# Patient Record
Sex: Male | Born: 1935 | Race: White | Hispanic: No | Marital: Married | Smoking: Former smoker
Health system: Southern US, Community
[De-identification: ages and names within clinical notes are randomized; demographics above are authoritative.]

## PROBLEM LIST (undated history)

## (undated) DIAGNOSIS — I10 Essential (primary) hypertension: Secondary | ICD-10-CM

## (undated) DIAGNOSIS — K5792 Diverticulitis of intestine, part unspecified, without perforation or abscess without bleeding: Secondary | ICD-10-CM

## (undated) DIAGNOSIS — K219 Gastro-esophageal reflux disease without esophagitis: Secondary | ICD-10-CM

## (undated) DIAGNOSIS — E785 Hyperlipidemia, unspecified: Secondary | ICD-10-CM

## (undated) DIAGNOSIS — I5022 Chronic systolic (congestive) heart failure: Secondary | ICD-10-CM

## (undated) DIAGNOSIS — E119 Type 2 diabetes mellitus without complications: Secondary | ICD-10-CM

## (undated) DIAGNOSIS — R252 Cramp and spasm: Secondary | ICD-10-CM

## (undated) DIAGNOSIS — T508X5A Adverse effect of diagnostic agents, initial encounter: Secondary | ICD-10-CM

## (undated) DIAGNOSIS — I4891 Unspecified atrial fibrillation: Secondary | ICD-10-CM

## (undated) DIAGNOSIS — N429 Disorder of prostate, unspecified: Secondary | ICD-10-CM

## (undated) DIAGNOSIS — I255 Ischemic cardiomyopathy: Secondary | ICD-10-CM

## (undated) DIAGNOSIS — N183 Chronic kidney disease, stage 3 unspecified: Secondary | ICD-10-CM

## (undated) DIAGNOSIS — K922 Gastrointestinal hemorrhage, unspecified: Secondary | ICD-10-CM

## (undated) DIAGNOSIS — I9789 Other postprocedural complications and disorders of the circulatory system, not elsewhere classified: Secondary | ICD-10-CM

## (undated) DIAGNOSIS — I2510 Atherosclerotic heart disease of native coronary artery without angina pectoris: Secondary | ICD-10-CM

## (undated) DIAGNOSIS — Z789 Other specified health status: Secondary | ICD-10-CM

## (undated) HISTORY — DX: Ischemic cardiomyopathy: I25.5

## (undated) HISTORY — DX: Essential (primary) hypertension: I10

## (undated) HISTORY — DX: Gastrointestinal hemorrhage, unspecified: K92.2

## (undated) HISTORY — DX: Chronic kidney disease, stage 3 (moderate): N18.3

## (undated) HISTORY — DX: Gastro-esophageal reflux disease without esophagitis: K21.9

## (undated) HISTORY — DX: Other postprocedural complications and disorders of the circulatory system, not elsewhere classified: I97.89

## (undated) HISTORY — DX: Atherosclerotic heart disease of native coronary artery without angina pectoris: I25.10

## (undated) HISTORY — DX: Chronic systolic (congestive) heart failure: I50.22

## (undated) HISTORY — DX: Disorder of prostate, unspecified: N42.9

## (undated) HISTORY — PX: ROTATOR CUFF REPAIR: SHX139

## (undated) HISTORY — PX: TONSILLECTOMY: SUR1361

## (undated) HISTORY — DX: Adverse effect of diagnostic agents, initial encounter: T50.8X5A

## (undated) HISTORY — PX: OTHER SURGICAL HISTORY: SHX169

## (undated) HISTORY — DX: Chronic kidney disease, stage 3 unspecified: N18.30

## (undated) HISTORY — DX: Diverticulitis of intestine, part unspecified, without perforation or abscess without bleeding: K57.92

## (undated) HISTORY — DX: Unspecified atrial fibrillation: I48.91

## (undated) HISTORY — DX: Other specified health status: Z78.9

## (undated) HISTORY — DX: Hyperlipidemia, unspecified: E78.5

## (undated) HISTORY — DX: Cramp and spasm: R25.2

## (undated) HISTORY — DX: Type 2 diabetes mellitus without complications: E11.9

---

## 2000-06-07 DIAGNOSIS — K922 Gastrointestinal hemorrhage, unspecified: Secondary | ICD-10-CM

## 2000-06-07 HISTORY — DX: Gastrointestinal hemorrhage, unspecified: K92.2

## 2001-04-07 HISTORY — PX: COLONOSCOPY: SHX174

## 2006-09-27 ENCOUNTER — Encounter: Payer: Self-pay | Admitting: Cardiology

## 2006-09-27 ENCOUNTER — Ambulatory Visit: Payer: Self-pay | Admitting: Cardiology

## 2007-05-08 HISTORY — PX: CORONARY ARTERY BYPASS GRAFT: SHX141

## 2007-05-29 ENCOUNTER — Ambulatory Visit: Payer: Self-pay | Admitting: Cardiology

## 2007-05-29 ENCOUNTER — Inpatient Hospital Stay (HOSPITAL_COMMUNITY): Admission: AD | Admit: 2007-05-29 | Discharge: 2007-06-09 | Payer: Self-pay | Admitting: Cardiology

## 2007-05-29 ENCOUNTER — Ambulatory Visit: Payer: Self-pay | Admitting: Cardiovascular Disease

## 2007-05-30 ENCOUNTER — Encounter: Payer: Self-pay | Admitting: Thoracic Surgery (Cardiothoracic Vascular Surgery)

## 2007-05-30 ENCOUNTER — Ambulatory Visit: Payer: Self-pay | Admitting: Thoracic Surgery (Cardiothoracic Vascular Surgery)

## 2007-05-30 ENCOUNTER — Encounter: Payer: Self-pay | Admitting: Cardiology

## 2007-06-07 ENCOUNTER — Encounter: Payer: Self-pay | Admitting: Cardiology

## 2007-06-29 ENCOUNTER — Ambulatory Visit: Payer: Self-pay | Admitting: Cardiology

## 2007-06-29 LAB — CONVERTED CEMR LAB
BUN: 20 mg/dL (ref 6–23)
CO2: 27 meq/L (ref 19–32)
Calcium: 9.9 mg/dL (ref 8.4–10.5)
Chloride: 96 meq/L (ref 96–112)
Creatinine, Ser: 1.6 mg/dL — ABNORMAL HIGH (ref 0.4–1.5)
GFR calc Af Amer: 54 mL/min
Glucose, Bld: 169 mg/dL — ABNORMAL HIGH (ref 70–99)
Potassium: 4.6 meq/L (ref 3.5–5.1)

## 2007-07-19 ENCOUNTER — Encounter
Admission: RE | Admit: 2007-07-19 | Discharge: 2007-07-19 | Payer: Self-pay | Admitting: Thoracic Surgery (Cardiothoracic Vascular Surgery)

## 2007-07-19 ENCOUNTER — Ambulatory Visit: Payer: Self-pay | Admitting: Thoracic Surgery (Cardiothoracic Vascular Surgery)

## 2007-07-26 ENCOUNTER — Encounter: Payer: Self-pay | Admitting: Cardiology

## 2007-08-08 ENCOUNTER — Ambulatory Visit: Payer: Self-pay | Admitting: Cardiology

## 2007-09-12 ENCOUNTER — Ambulatory Visit: Payer: Self-pay | Admitting: Cardiology

## 2007-09-19 ENCOUNTER — Encounter: Payer: Self-pay | Admitting: Physician Assistant

## 2007-11-09 ENCOUNTER — Ambulatory Visit: Payer: Self-pay | Admitting: Cardiology

## 2008-01-30 ENCOUNTER — Encounter: Payer: Self-pay | Admitting: Cardiology

## 2008-02-07 ENCOUNTER — Encounter: Payer: Self-pay | Admitting: Cardiology

## 2008-03-21 ENCOUNTER — Ambulatory Visit: Payer: Self-pay | Admitting: Cardiology

## 2008-05-07 ENCOUNTER — Encounter: Payer: Self-pay | Admitting: Cardiology

## 2008-05-24 IMAGING — CR DG CHEST 2V
2 series · 2 of 2 positions shown · non-contrast
Comparison: 06/02/07 and 05/29/07.

CLINICAL DATA: Coronary syndrome.  Cough, shortness of breath.  
 TWO VIEW CHEST:

[w chest pa]
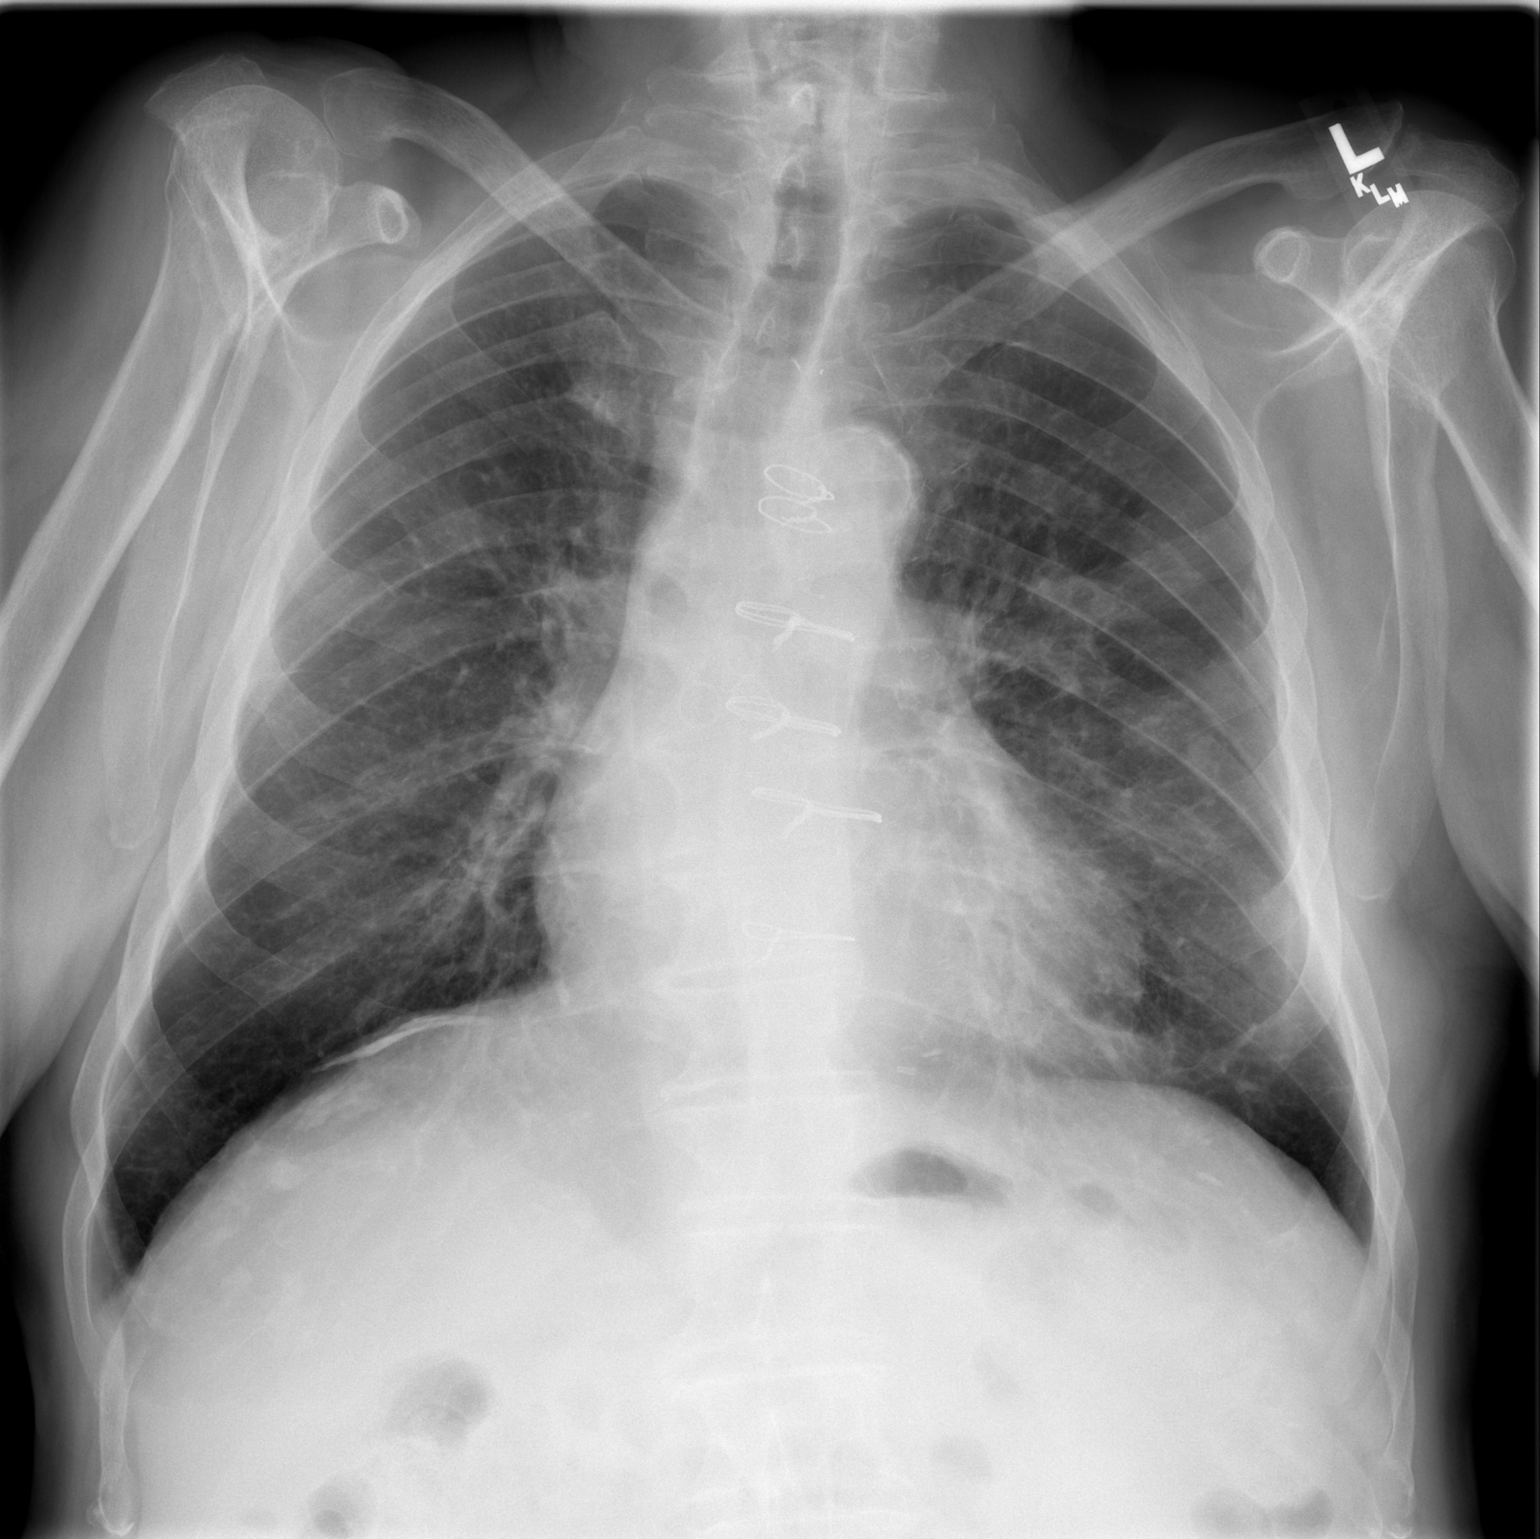

[w chest lat]
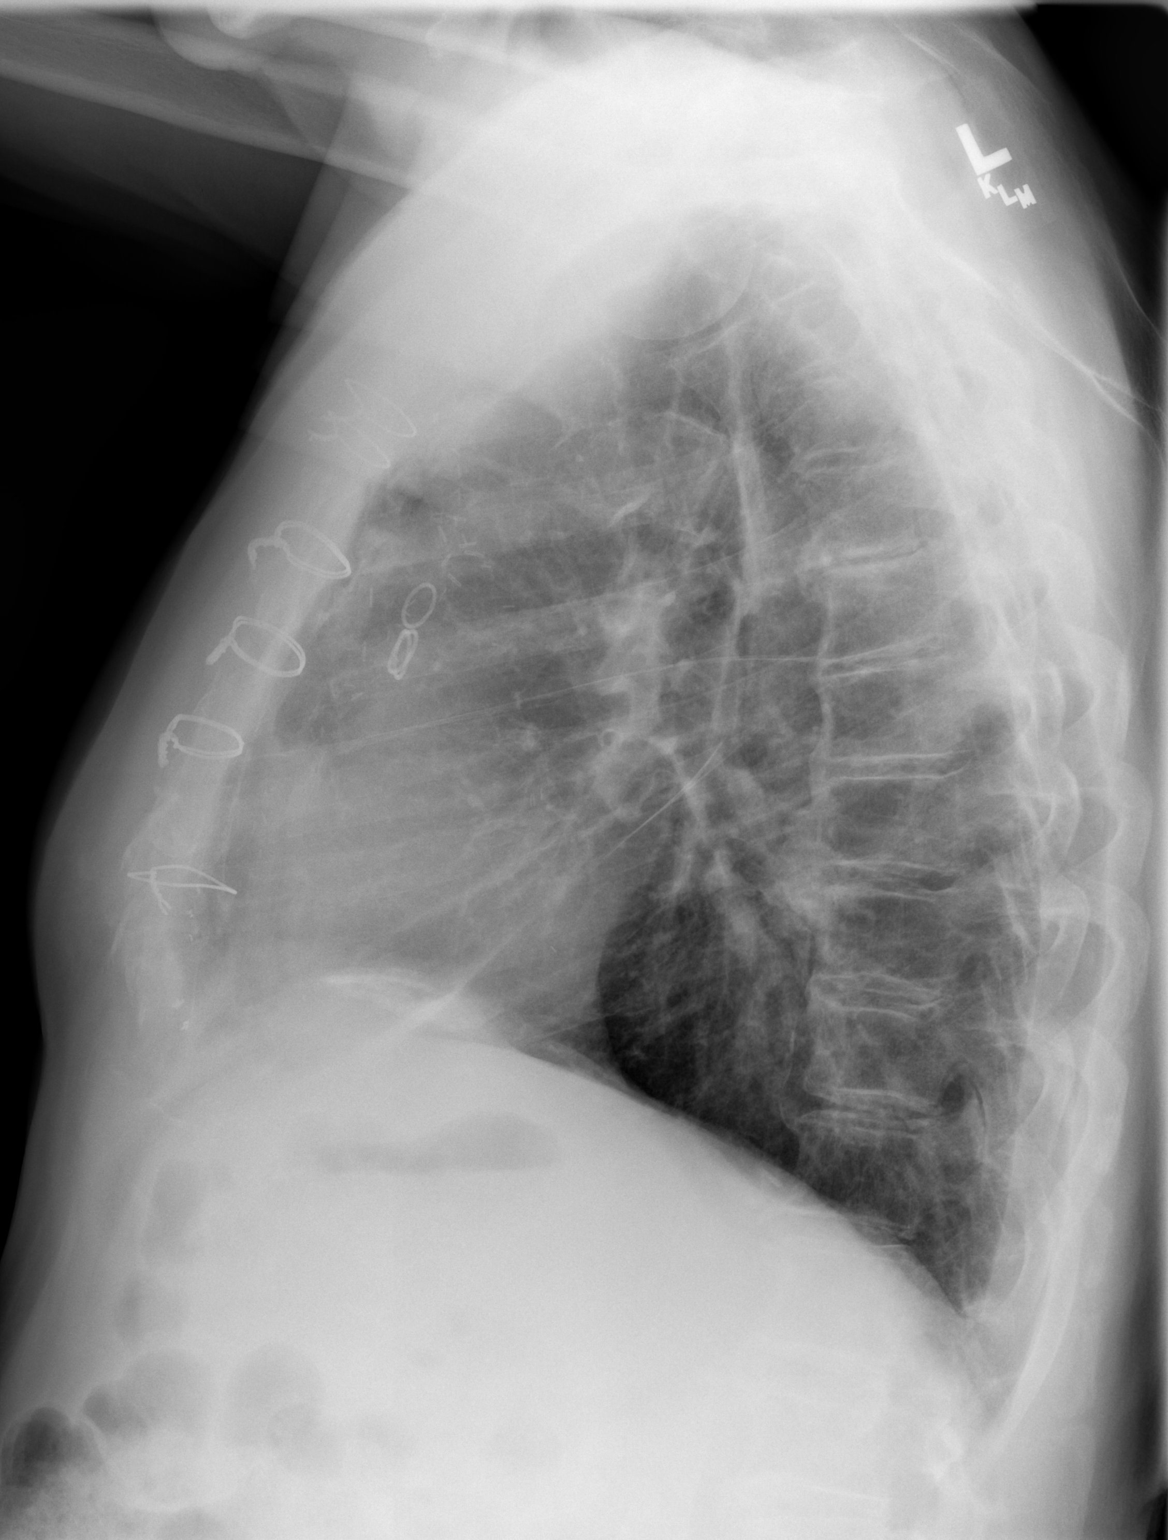

[2 of 2 positions shown; findings below may reference images not displayed]

FINDINGS: Trachea is midline.  Heart is mildly enlarged.  Thoracic aorta is calcified.  There are nodular densities in the left mid lung zone.  Lungs are otherwise clear.  Calcified pleural plaque is seen along the right hemidiaphragm.
IMPRESSION: Opacities seen in the left mid lung zone may be due to pleural plaque.  Pulmonary parenchymal air space disease not excluded.

## 2008-09-02 ENCOUNTER — Encounter: Payer: Self-pay | Admitting: Cardiology

## 2008-09-04 ENCOUNTER — Encounter: Payer: Self-pay | Admitting: Cardiology

## 2008-09-16 ENCOUNTER — Ambulatory Visit: Payer: Self-pay | Admitting: Cardiology

## 2008-09-19 ENCOUNTER — Encounter: Payer: Self-pay | Admitting: Cardiology

## 2008-09-19 DIAGNOSIS — N289 Disorder of kidney and ureter, unspecified: Secondary | ICD-10-CM

## 2008-10-04 ENCOUNTER — Other Ambulatory Visit: Admission: RE | Admit: 2008-10-04 | Discharge: 2008-10-04 | Payer: Self-pay | Admitting: Otolaryngology

## 2009-03-25 ENCOUNTER — Encounter: Payer: Self-pay | Admitting: Cardiology

## 2009-03-26 ENCOUNTER — Ambulatory Visit: Payer: Self-pay | Admitting: Cardiology

## 2010-01-05 ENCOUNTER — Telehealth (INDEPENDENT_AMBULATORY_CARE_PROVIDER_SITE_OTHER): Payer: Self-pay | Admitting: *Deleted

## 2010-01-05 ENCOUNTER — Ambulatory Visit: Payer: Self-pay | Admitting: Physician Assistant

## 2010-01-07 ENCOUNTER — Encounter: Payer: Self-pay | Admitting: Physician Assistant

## 2010-01-08 ENCOUNTER — Encounter: Payer: Self-pay | Admitting: Physician Assistant

## 2010-01-08 ENCOUNTER — Ambulatory Visit: Payer: Self-pay | Admitting: Cardiology

## 2010-01-09 ENCOUNTER — Encounter: Payer: Self-pay | Admitting: Physician Assistant

## 2010-01-14 ENCOUNTER — Ambulatory Visit: Payer: Self-pay | Admitting: Physician Assistant

## 2010-01-21 ENCOUNTER — Encounter: Payer: Self-pay | Admitting: Physician Assistant

## 2010-01-30 ENCOUNTER — Ambulatory Visit: Payer: Self-pay | Admitting: Cardiology

## 2010-01-30 ENCOUNTER — Encounter: Payer: Self-pay | Admitting: Physician Assistant

## 2010-02-12 ENCOUNTER — Encounter: Payer: Self-pay | Admitting: Physician Assistant

## 2010-03-13 ENCOUNTER — Encounter: Payer: Self-pay | Admitting: Physician Assistant

## 2010-03-16 ENCOUNTER — Ambulatory Visit: Payer: Self-pay | Admitting: Cardiology

## 2010-03-19 ENCOUNTER — Encounter: Payer: Self-pay | Admitting: Cardiology

## 2010-04-15 ENCOUNTER — Ambulatory Visit: Payer: Self-pay | Admitting: Cardiology

## 2010-05-13 ENCOUNTER — Encounter: Payer: Self-pay | Admitting: Cardiology

## 2010-05-13 ENCOUNTER — Ambulatory Visit: Payer: Self-pay | Admitting: Cardiology

## 2010-06-03 ENCOUNTER — Encounter: Payer: Self-pay | Admitting: Cardiology

## 2010-06-12 ENCOUNTER — Telehealth (INDEPENDENT_AMBULATORY_CARE_PROVIDER_SITE_OTHER): Payer: Self-pay | Admitting: *Deleted

## 2010-06-28 ENCOUNTER — Encounter: Payer: Self-pay | Admitting: Thoracic Surgery (Cardiothoracic Vascular Surgery)

## 2010-07-07 NOTE — Progress Notes (Signed)
Summary: SOB  Phone Note Call from Patient   Summary of Call: Pt's dgt, Jenny Reichmann, called stating pt started with hoarseness on Thursday. She states since Friday he has been really weak and SOB. She states they have one of those balls that you blow on and he can't even blow 1/2 way. She would like pt seen this week. Pt's dgt notified he should be evaluated at ER for sudden development of SOB. Pt's dgt states she doesn't think he's bad enough to need to be seen in ER but would like him seen ASAP in the office. Pt added to Gene's schedule today at 2pm. Pt's dgt notified pt will be seeing PA not MD. Initial call taken by: Gurney Maxin, RN, BSN,  January 05, 2010 8:26 AM

## 2010-07-07 NOTE — Assessment & Plan Note (Signed)
Summary: 1 MO F/U PER 10/10 OV-JM   Visit Type:  Follow-up Primary John Moore:  John Moore  CC:  cardiomyopathy  /  hypertension.  History of Present Illness: The patient is seen for followup of cardiomyopathy.  I saw him last on March 16, 2010.  At that time we were working to clarify his medications and adjust them slightly.  The plan was to be sure that he was taking his diuretic.  Also I kept him on the same dose of carvedilol area he's had elevated potassium in the past.  We did not give him extra potassium.  He returns today feeling well.  However his blood pressure still elevated.  He says that his pressure was much lower at home today.  He may have a problem with his blood pressure cuff and he is receiving a new one for his birthday this week.  Preventive Screening-Counseling & Management  Alcohol-Tobacco     Smoking Status: quit     Year Quit: 1980  Current Medications (verified): 1)  Benicar 40 Mg Tabs (Olmesartan Medoxomil) .... Take 1 Tab By Mouth At Bedtime 2)  Glimepiride 4 Mg Tabs (Glimepiride) .... Take 1 Tablet By Mouth Two Times A Day 3)  Multivitamins   Tabs (Multiple Vitamin) .... Take 1 Tablet By Mouth Once A Day 4)  Aspirin 81 Mg Tbec (Aspirin) .... Take One Tablet By Mouth Daily 5)  Metformin Hcl 500 Mg Tabs (Metformin Hcl) .... Take 2 By Mouth in Morning and 1 in Evening 6)  Simvastatin 40 Mg Tabs (Simvastatin) .... Take 1 Tab By Mouth At Bedtime 7)  Uroxatral 10 Mg Xr24h-Tab (Alfuzosin Hcl) .... Take 1 Tab By Mouth At Bedtime 8)  Nitrostat 0.4 Mg Subl (Nitroglycerin) .Marland Kitchen.. 1 Tablet Under Tongue At Onset of Chest Pain; You May Repeat Every 5 Minutes For Up To 3 Doses. 9)  Furosemide 40 Mg Tabs (Furosemide) .... Take One Tablet By Mouth Once Daily 10)  Coreg 12.5 Mg Tabs (Carvedilol) .... Take 1 Tablet By Mouth Two Times A Day  Allergies (verified): 1)  ! * Ivp Dye  Comments:  Nurse/Medical Assistant: The patient's medication list and allergies were reviewed  with the patient and were updated in the Medication and Allergy Lists.  Past History:  Past Medical History:  * NIPPLE DISCOMFORT...resolved October, 2010 LEG CRAMPS (ICD-729.82)...nighttime * BURPING AND HICCUPS....question reflux..improved October, 2010 * ACE INHIBITOR.. INTOLERANCE....tolerates ARB GI BLEEDING (ICD-578.9)...lower.. 2002.. * CONTRAST DYE ALLERGY RENAL INSUFFICIENCY (ICD-588.9)...mild..creatinine 1.5.. September 19, 2008 DM (ICD-250.00) DYSLIPIDEMIA (ICD-272.4) HYPERTENSION (ICD-401.9) ATRIAL FIBRILLATION, HX OF (ICD-V12.59)...postop CABG...... brief treatment amiodarone... stopped... NSR * EF 40-45%.Marland Kitchenecho... December, 2008... 20-25% by echo, 8/11 Pulmonary hypertension, severe Mitral regurgitation... mild.. echo... 2008... mild/moderate  by echo, 8/11 CORONARY ARTERY BYPASS GRAFT, HX OF (ICD-V45.81)..December, 2008 CAD (ICD-414.00)  Review of Systems       Patient denies fever, chills, headache, sweats, rash, change in vision, change in hearing, chest pain, cough, nausea vomiting, urinary symptoms.  All other systems are reviewed and are negative.  Vital Signs:  Patient profile:   77 year old male Height:      69 inches Weight:      175 pounds Pulse rate:   56 / minute BP sitting:   181 / 101  (left arm) Cuff size:   regular  Vitals Entered By: Georgina Peer (April 15, 2010 1:46 PM)  Physical Exam  General:  patient looks good. Eyes:  no xanthelasma. Neck:  no jugular venous distention. Lungs:  lungs are clear.  Respiratory effort is nonlabored. Heart:  cardiac exam reveals a fullness no clicks or significant murmurs Abdomen:  abdomen is soft. Extremities:  no peripheral edema. Psych:  patient is oriented to person time and place.  Affect is normal.  He is here with his daughter today.   Impression & Recommendations:  Problem # 1:  * ELEVATED POTASSIUM His potassium is stabilized and we are careful not to give him extra.  Problem # 2:  CHRONIC  SYSTOLIC HEART FAILURE (123456)  His updated medication list for this problem includes:    Benicar 40 Mg Tabs (Olmesartan medoxomil) .Marland Kitchen... Take 1 tab by mouth at bedtime    Aspirin 81 Mg Tbec (Aspirin) .Marland Kitchen... Take one tablet by mouth daily    Nitrostat 0.4 Mg Subl (Nitroglycerin) .Marland Kitchen... 1 tablet under tongue at onset of chest pain; you may repeat every 5 minutes for up to 3 doses.    Furosemide 40 Mg Tabs (Furosemide) .Marland Kitchen... Take one tablet by mouth once daily    Coreg 12.5 Mg Tabs (Carvedilol) .Marland Kitchen... Take 1 tablet by mouth two times a day    Amlodipine Besylate 5 Mg Tabs (Amlodipine besylate) .Marland Kitchen... Take 1/2 tablet by mouth once daily for 1 week and then increase to 1 tablet once daily. His blood pressure is too high and his heart rate is controlled.  I decided to add amlodipine.  It is possible that hypertension has been the cause of his LV dysfunction.  Will start at 2.5 mg daily but increase it to 5 mg daily after one week.  We'll see him back in 3-4 weeks.  Problem # 3:  CAD (ICD-414.00)  His updated medication list for this problem includes:    Aspirin 81 Mg Tbec (Aspirin) .Marland Kitchen... Take one tablet by mouth daily    Nitrostat 0.4 Mg Subl (Nitroglycerin) .Marland Kitchen... 1 tablet under tongue at onset of chest pain; you may repeat every 5 minutes for up to 3 doses.    Coreg 12.5 Mg Tabs (Carvedilol) .Marland Kitchen... Take 1 tablet by mouth two times a day    Amlodipine Besylate 5 Mg Tabs (Amlodipine besylate) .Marland Kitchen... Take 1/2 tablet by mouth once daily for 1 week and then increase to 1 tablet once daily. Coronary disease stable.  No change in therapy.  Patient Instructions: 1)  Follow up with Dr. Ron Parker on Wed, May 13, 2010 at 1pm. 2)  Start Norvasc (amlodipine) 5mg  1/2 tablet by mouth once daily for 1 week and then increase to 1 tablet by mouth once daily. Prescriptions: AMLODIPINE BESYLATE 5 MG TABS (AMLODIPINE BESYLATE) Take 1/2 tablet by mouth once daily for 1 week and then increase to 1 tablet once daily.  #30  x 2   Entered by:   Gurney Maxin, RN, BSN   Authorized by:   Lorenza Evangelist, MD, [facility]   Signed by:   Gurney Maxin, RN, BSN on 04/15/2010   Method used:   Electronically to        [facility] (retail)       [address]       [place], [place]  [address]       Ph: [phone]       Fax: [phone]   RxID:   [id number]   Handout requested.

## 2010-07-07 NOTE — Medication Information (Signed)
Summary: RX Folder/ CARVEDILOL  RX Folder/ CARVEDILOL   Imported By: Bartholomew Boards 03/24/2010 14:58:57  _____________________________________________________________________  External Attachment:    Type:   Image     Comment:   External Document

## 2010-07-07 NOTE — Assessment & Plan Note (Signed)
Summary: DGT REQUEST APPT D/T SOB-JM   Visit Type:  sob Primary Provider:  Jerene Bears   History of Present Illness: patient presents for evaluation of dyspnea.  He was last seen here in clinic in October 2010, by Dr. Ron Parker, in followup of multivessel CAD, status post CABG, December 2008. He has not had subsequent ischemic workup.  He presents with complaint of exertional dyspnea over the past few weeks. He denies symptoms suggestive of congestive heart failure. He denies frank exertional chest discomfort, or tachycardia palpitations. He denies presyncope/syncope or falls. There is some suggestion that these symptoms are similar to those which preceded his bypass surgery.  Preventive Screening-Counseling & Management  Alcohol-Tobacco     Smoking Status: quit     Year Quit: 1980  Current Medications (verified): 1)  Benicar 40 Mg Tabs (Olmesartan Medoxomil) .... Take 1 Tab By Mouth At Bedtime 2)  Metoprolol Succinate 50 Mg Xr24h-Tab (Metoprolol Succinate) .... Take 1 Tablet By Mouth Once A Day 3)  Glimepiride 4 Mg Tabs (Glimepiride) .... Take 1 Tablet By Mouth Two Times A Day 4)  Multivitamins   Tabs (Multiple Vitamin) .... Take 1 Tablet By Mouth Once A Day 5)  Aspirin 81 Mg Tbec (Aspirin) .... Take One Tablet By Mouth Daily 6)  Metformin Hcl 500 Mg Tabs (Metformin Hcl) .... Take 1 Tablet By Mouth Two Times A Day 7)  Simvastatin 40 Mg Tabs (Simvastatin) .... Take 1 Tab By Mouth At Bedtime 8)  Uroxatral 10 Mg Xr24h-Tab (Alfuzosin Hcl) .... Take 1 Tab By Mouth At Bedtime  Allergies (verified): 1)  ! * Ivp Dye  Comments:  Nurse/Medical Assistant: The patient's medication list and allergies were reviewed with the patient and were updated in the Medication and Allergy Lists.  Past History:  Past Medical History: Last updated: 03/26/2009  * NIPPLE DISCOMFORT...resolved October, 2010 LEG CRAMPS (ICD-729.82)...nighttime * BURPING AND HICCUPS....question reflux..improved October,  2010 * ACE INHIBITOR.. INTOLERANCE....tolerates ARB GI BLEEDING (ICD-578.9)...lower.. 2002 * CONTRAST DYE ALLERGY RENAL INSUFFICIENCY (ICD-588.9)...mild..creatinine 1.5.. September 19, 2008 DM (ICD-250.00) DYSLIPIDEMIA (ICD-272.4) HYPERTENSION (ICD-401.9) ATRIAL FIBRILLATION, HX OF (ICD-V12.59)...postop CABG...... brief treatment amiodarone... stopped... NSR * EF 40-45%.Marland Kitchenecho... December, 2008 Mitral regurgitation... mild.. echo... 2008 CORONARY ARTERY BYPASS GRAFT, HX OF (ICD-V45.81)..December, 2008 CAD (ICD-414.00)  Review of Systems       No fevers, chills, hemoptysis, dysphagia, melena, hematocheezia, hematuria, rash, claudication, orthopnea, pnd, pedal edema. All other systems negative.   Vital Signs:  Patient profile:   77 year old male Height:      69 inches Weight:      181 pounds BMI:     26.83 O2 Sat:      97 % on Room air Pulse rate:   67 / minute BP sitting:   176 / 119  (left arm) Cuff size:   regular  Vitals Entered By: Georgina Peer (January 05, 2010 2:30 PM)  Nutrition Counseling: Patient's BMI is greater than 25 and therefore counseled on weight management options.  O2 Flow:  Room air  Serial Vital Signs/Assessments:  Time      Position  BP       Pulse  Resp  Temp     By 2:33 PM             185/78   71                    Georgina Peer  Comments: 2:33 PM recheck left arm/reg cuff By: Georgina Peer  Physical Exam  Additional Exam:  GEN:77 year old male, sitting upright, in no distress HEENT: NCAT,PERRLA,EOMI NECK: palpable pulses, no bruits; no JVD; no TM LUNGS: CTA bilaterally HEART: RRR (S1S2); soft, grade A999333 systolic ejection murmur; positive S4; no rubs ABD: soft, NT; intact BS EXT: intact distal pulses; trace peripheral edema SKIN: warm, dry; somewhat pallid MUSC: no obvious deformity NEURO: A/O (x3)     EKG  Procedure date:  01/05/2010  Findings:      normal sinus rhythm with ventricular bigeminy at 67 bpm; question prior  septal infarct  Impression & Recommendations:  Problem # 1:  DYSPNEA ON EXERTION (ICD-786.09)  patient presents with complaint of DOE, which may represent an anginal equivalent. He denies any symptoms at rest, and denies exertional chest discomfort. Will initiate evaluation with a 2-D echo for reassessment of LV function. Records indicate prior assessment at 40-45% by 2-D echo, December 2008. We'll also order complete blood work, with complete metabolic profile, CBC, TSH, BNP, and FLP. Will schedule early return followup with myself and Dr. Ron Parker for review, and further recommendations. Patient has also been advised to proceed directly to the ED, in the event of worsening symptoms or new onset angina pectoris.  Will also increase Toprol to 50 b.i.d., for better blood pressure control as well as suppression of documented PVCs. We'll prescribe p.r.n. nitroglycerin.  Problem # 2:  HYPERTENSION (ICD-401.9)  increase Toprol, as above.  Problem # 3:  DYSLIPIDEMIA (ICD-272.4)  reassess with a fasting lipid profile. Aggressive management recommended with target LDL of 70 or less, if feasible.  Problem # 4:  ATRIAL FIBRILLATION, HX OF (ICD-V12.59)  maintaining NSR with documented ventricular bigeminy, at this time.  Problem # 5:  MITRAL REGURGITATION (ICD-396.3)  we'll reassess severity with 2-D echo, previously assessed as mild in 2008.  Other Orders: EKG w/ Interpretation (93000) T-Comprehensive Metabolic Panel (A999333) T-CBC No Diff [id number]) T-BNP  (B Natriuretic Peptide) [id number]) T-TSH [id number]) T-Lipid Profile [id number]) 2-D Echocardiogram (2D Echo)  Patient Instructions: 1)  Your physician recommends that you go to the [facility] for lab work: Do not eat or drink after midnight. Do all labs same day. You can do at the [facility] or at the hospital on the same day as your Echo (if it is scheduled early am).  2)  Your physician has requested that you have an  echocardiogram.  Echocardiography is a painless test that uses sound waves to create images of your heart. It provides your doctor with information about the size and shape of your heart and how well your heart's chambers and valves are working.  This procedure takes approximately one hour. There are no restrictions for this procedure. 3)  Increase Toprol (metoprolol) to 50mg  by mouth two times a day.  4)  Your physician recommended you take 1 tablet (or 1 spray) under tongue at onset of chest pain; you may repeat every 5 minutes for up to 3 doses. If 3 or more doses are required, call 911 and proceed to the ER immediately. 5)  Follow up appt with Gene Tarahji Ramthun, PA-C on Wednesday, January 14, 2010 at 1:15pm. Prescriptions: NITROSTAT 0.4 MG SUBL (NITROGLYCERIN) 1 tablet under tongue at onset of chest pain; you may repeat every 5 minutes for up to 3 doses.  #25 x 3   Entered by:   Gurney Maxin, RN, BSN   Authorized by:   Maryjane Hurter, PA-C   Signed by:   Gurney Maxin, RN, BSN on 01/05/2010  Method used:   Electronically to        [facility] (retail)       [place], [place]  [address]       Ph: [phone]       Fax: [phone]   RxID:   [id number] METOPROLOL SUCCINATE 50 MG XR24H-TAB (METOPROLOL SUCCINATE) Take 1 tablet by mouth two times a day  #180 x 3   Entered by:   Gurney Maxin, RN, BSN   Authorized by:   Maryjane Hurter, PA-C   Signed by:   Gurney Maxin, RN, BSN on 01/05/2010   Method used:   Electronically to        [facility]* (retail)             ,          Ph: [phone]       Fax: [phone]   RxID:   [id number]

## 2010-07-07 NOTE — Assessment & Plan Note (Signed)
Summary: 2 WK F/U PER 8/10 OV W/GENE/KATZ-JM   Visit Type:  Follow-up Primary Provider:  Jerene Bears   History of Present Illness: patient presents for early scheduled followup.  Patient reports significant improvement in overall exercise tolerance, since being placed on furosemide. he reports significant improvement in overall exercise tolerance. He denies symptoms suggestive of decompensated heart failure, or unstable angina pectoris. He denies tachycardia palpitations.  Followup labs notable for potassium 4.7, stable CRI with creatinine 1.5, and improved BNP level of 705, down from 1500.  Preventive Screening-Counseling & Management  Alcohol-Tobacco     Smoking Status: quit     Year Quit: 1980  Current Medications (verified): 1)  Benicar 40 Mg Tabs (Olmesartan Medoxomil) .... Take 1 Tab By Mouth At Bedtime 2)  Glimepiride 4 Mg Tabs (Glimepiride) .... Take 1 Tablet By Mouth Two Times A Day 3)  Multivitamins   Tabs (Multiple Vitamin) .... Take 1 Tablet By Mouth Once A Day 4)  Aspirin 81 Mg Tbec (Aspirin) .... Take One Tablet By Mouth Daily 5)  Metformin Hcl 500 Mg Tabs (Metformin Hcl) .... Take 2 By Mouth in Morning and 1 in Evening 6)  Simvastatin 40 Mg Tabs (Simvastatin) .... Take 1 Tab By Mouth At Bedtime 7)  Uroxatral 10 Mg Xr24h-Tab (Alfuzosin Hcl) .... Take 1 Tab By Mouth At Bedtime 8)  Nitrostat 0.4 Mg Subl (Nitroglycerin) .Marland Kitchen.. 1 Tablet Under Tongue At Onset of Chest Pain; You May Repeat Every 5 Minutes For Up To 3 Doses. 9)  Furosemide 40 Mg Tabs (Furosemide) .... Take One Tablet By Mouth Daily. 10)  Coreg 12.5 Mg Tabs (Carvedilol) .... Take 1 Tablet By Mouth Two Times A Day  Allergies (verified): 1)  ! * Ivp Dye  Comments:  Nurse/Medical Assistant: The patient's medication bottles and allergies were reviewed with the patient and were updated in the Medication and Allergy Lists.  Past History:  Past Medical History: Last updated: 01/14/2010  * NIPPLE  DISCOMFORT...resolved October, 2010 LEG CRAMPS (ICD-729.82)...nighttime * BURPING AND HICCUPS....question reflux..improved October, 2010 * ACE INHIBITOR.. INTOLERANCE....tolerates ARB GI BLEEDING (ICD-578.9)...lower.. 2002 * CONTRAST DYE ALLERGY RENAL INSUFFICIENCY (ICD-588.9)...mild..creatinine 1.5.. September 19, 2008 DM (ICD-250.00) DYSLIPIDEMIA (ICD-272.4) HYPERTENSION (ICD-401.9) ATRIAL FIBRILLATION, HX OF (ICD-V12.59)...postop CABG...... brief treatment amiodarone... stopped... NSR * EF 40-45%.Marland Kitchenecho... December, 2008... 20-25% by echo, 8/11 Pulmonary hypertension, severe Mitral regurgitation... mild.. echo... 2008... mild/moderate  by echo, 8/11 CORONARY ARTERY BYPASS GRAFT, HX OF (ICD-V45.81)..December, 2008 CAD (ICD-414.00)  Review of Systems       No fevers, chills, hemoptysis, dysphagia, melena, hematocheezia, hematuria, rash, claudication, orthopnea, pnd, pedal edema. All other systems negative.   Vital Signs:  Patient profile:   77 year old male Height:      69 inches Weight:      176 pounds O2 Sat:      98 % on Room air Pulse rate:   53 / minute BP sitting:   159 / 95  (left arm) Cuff size:   regular  Vitals Entered By: Georgina Peer (January 30, 2010 1:43 PM)  O2 Flow:  Room air  Physical Exam  Additional Exam:  GEN:77 year old male, sitting upright, in no distress HEENT: NCAT,PERRLA,EOMI NECK: palpable pulses, no bruits; no JVD; no TM LUNGS: Mild basilar crackles, no wheezes HEART: RRR (S1S2); soft, grade A999333 systolic ejection murmur; positive S4; no rubs ABD: soft, NT; intact BS EXT: intact distal pulses; trace peripheral edema SKIN: warm, dry; somewhat pallid MUSC: no obvious deformity NEURO: A/O (x3)     EKG  Procedure date:  01/30/2010  Findings:      normal sinus rhythm at 60 bpm; IVCD; isolated PVCs  Impression & Recommendations:  Problem # 1:  CHRONIC SYSTOLIC HEART FAILURE (123456)  discontinue Toprol, and substitute with  carvedilol 12.5 mg b.i.d. Uptitrate as blood pressure/heart rate allow, in attempt to optimize medical therapy. Will need a repeat echocardiogram in the near future, to help determine whether or not he is a feasible candidate for ICD implantation. Repeat metabolic profile/BNP level today.  The following medications were removed from the medication list:    Metoprolol Succinate 50 Mg Xr24h-tab (Metoprolol succinate) .Marland Kitchen... Take 1 tablet by mouth two times a day His updated medication list for this problem includes:    Benicar 40 Mg Tabs (Olmesartan medoxomil) .Marland Kitchen... Take 1 tab by mouth at bedtime    Aspirin 81 Mg Tbec (Aspirin) .Marland Kitchen... Take one tablet by mouth daily    Nitrostat 0.4 Mg Subl (Nitroglycerin) .Marland Kitchen... 1 tablet under tongue at onset of chest pain; you may repeat every 5 minutes for up to 3 doses.    Furosemide 40 Mg Tabs (Furosemide) .Marland Kitchen... Take one tablet by mouth daily.    Coreg 12.5 Mg Tabs (Carvedilol) .Marland Kitchen... Take 1 tablet by mouth two times a day  Orders: T-Basic Metabolic Panel (99991111) T-BNP  (B Natriuretic Peptide) [phone])  Problem # 2:  CORONARY ARTERY BYPASS GRAFT, HX OF (ICD-V45.81) Assessment: Comment Only  Problem # 3:  HYPERTENSION (ICD-401.9)  continue to monitor closely, and reassess following today's addition of carvedilol.  The following medications were removed from the medication list:    Metoprolol Succinate 50 Mg Xr24h-tab (Metoprolol succinate) .Marland Kitchen... Take 1 tablet by mouth two times a day His updated medication list for this problem includes:    Benicar 40 Mg Tabs (Olmesartan medoxomil) .Marland Kitchen... Take 1 tab by mouth at bedtime    Aspirin 81 Mg Tbec (Aspirin) .Marland Kitchen... Take one tablet by mouth daily    Furosemide 40 Mg Tabs (Furosemide) .Marland Kitchen... Take one tablet by mouth daily.    Coreg 12.5 Mg Tabs (Carvedilol) .Marland Kitchen... Take 1 tablet by mouth two times a day  Orders: T-Basic Metabolic Panel (99991111) T-BNP  (B Natriuretic Peptide) [phone])  Problem #  4:  VENTRICULAR ECTOPY (ICD-427.69) Assessment: Comment Only  The following medications were removed from the medication list:    Metoprolol Succinate 50 Mg Xr24h-tab (Metoprolol succinate) .Marland Kitchen... Take 1 tablet by mouth two times a day His updated medication list for this problem includes:    Aspirin 81 Mg Tbec (Aspirin) .Marland Kitchen... Take one tablet by mouth daily    Nitrostat 0.4 Mg Subl (Nitroglycerin) .Marland Kitchen... 1 tablet under tongue at onset of chest pain; you may repeat every 5 minutes for up to 3 doses.    Coreg 12.5 Mg Tabs (Carvedilol) .Marland Kitchen... Take 1 tablet by mouth two times a day  Problem # 5:  RENAL INSUFFICIENCY (ICD-588.9) Assessment: Comment Only  Other Orders: EKG w/ Interpretation (93000)  Patient Instructions: 1)  Labs:  BMET, BNP  2)  Stop Toprol 3)  Start Coreg 12.5mg  two times a day 4)  Follow up in  3-4 weeks Prescriptions: COREG 12.5 MG TABS (CARVEDILOL) Take 1 tablet by mouth two times a day  #60 x 1   Entered by:   Lovina Reach, LPN   Authorized by:   Maryjane Hurter, PA-C   Signed by:   Lovina Reach, LPN on [id number]   Method used:   Electronically to  [facility]* (retail)       [address]       [place], [place]  [address]       Ph: [phone]       Fax: [phone]   RxID:   [id number]

## 2010-07-07 NOTE — Assessment & Plan Note (Signed)
Summary: f4m --agh   Visit Type:  Follow-up Primary Provider:  Jerene Bears  CC:  cardiomyopathy.  History of Present Illness: The patient is seen today for followup of cardiomyopathy.  He was seen last in our office on January 30, 2010.  His BUN was 26 and creatinine 1.5 .  He then had labs done on February 12, 2010.  Potassium was up to 5.0.  BUN was up to 40 and creatinine up to 1.8.  The patient ran out of his Lasix.  He has not taken any for several days.  He then had followup labs on March 13, 2010.  BUN was down to 23 and creatinine 1.43.  From this information it seems that the dose of Lasix 40 b.i.d. was too high for him.  His blood pressure has been stable both at home and in other doctors offices.  However his systolic pressure today is 190.  Preventive Screening-Counseling & Management  Alcohol-Tobacco     Smoking Status: quit     Year Quit: 1980  Current Medications (verified): 1)  Benicar 40 Mg Tabs (Olmesartan Medoxomil) .... Take 1 Tab By Mouth At Bedtime 2)  Glimepiride 4 Mg Tabs (Glimepiride) .... Take 1 Tablet By Mouth Two Times A Day 3)  Multivitamins   Tabs (Multiple Vitamin) .... Take 1 Tablet By Mouth Once A Day 4)  Aspirin 81 Mg Tbec (Aspirin) .... Take One Tablet By Mouth Daily 5)  Metformin Hcl 500 Mg Tabs (Metformin Hcl) .... Take 2 By Mouth in Morning and 1 in Evening 6)  Simvastatin 40 Mg Tabs (Simvastatin) .... Take 1 Tab By Mouth At Bedtime 7)  Uroxatral 10 Mg Xr24h-Tab (Alfuzosin Hcl) .... Take 1 Tab By Mouth At Bedtime 8)  Nitrostat 0.4 Mg Subl (Nitroglycerin) .Marland Kitchen.. 1 Tablet Under Tongue At Onset of Chest Pain; You May Repeat Every 5 Minutes For Up To 3 Doses. 9)  Furosemide 40 Mg Tabs (Furosemide) .... Take One Tablet By Mouth Two Times A Day 10)  Coreg 12.5 Mg Tabs (Carvedilol) .... Take 1 Tablet By Mouth Two Times A Day  Allergies (verified): 1)  ! * Ivp Dye  Comments:  Nurse/Medical Assistant: The patient's medication list and allergies were  reviewed with the patient and were updated in the Medication and Allergy Lists.  Past History:  Past Medical History:  * NIPPLE DISCOMFORT...resolved October, 2010 LEG CRAMPS (ICD-729.82)...nighttime * BURPING AND HICCUPS....question reflux..improved October, 2010 * ACE INHIBITOR.. INTOLERANCE....tolerates ARB GI BLEEDING (ICD-578.9)...lower.. 2002 * CONTRAST DYE ALLERGY RENAL INSUFFICIENCY (ICD-588.9)...mild..creatinine 1.5.. September 19, 2008 DM (ICD-250.00) DYSLIPIDEMIA (ICD-272.4) HYPERTENSION (ICD-401.9) ATRIAL FIBRILLATION, HX OF (ICD-V12.59)...postop CABG...... brief treatment amiodarone... stopped... NSR * EF 40-45%.Marland Kitchenecho... December, 2008... 20-25% by echo, 8/11 Pulmonary hypertension, severe Mitral regurgitation... mild.. echo... 2008... mild/moderate  by echo, 8/11 CORONARY ARTERY BYPASS GRAFT, HX OF (ICD-V45.81)..December, 2008 CAD (ICD-414.00)  Review of Systems       Patient denies fever, chills, headache, sweats, rash, change in vision, change in hearing, chest pain, cough, nausea vomiting, urinary symptoms.  All other systems are reviewed and are negative.  Vital Signs:  Patient profile:   77 year old male Height:      69 inches Weight:      181 pounds Pulse rate:   56 / minute BP sitting:   190 / 80  (left arm) Cuff size:   large  Vitals Entered By: Georgina Peer (March 16, 2010 1:59 PM)  Physical Exam  General:  patient is stable. Eyes:  no xanthelasma. Neck:  no jugular venous distention. Lungs:  lungs are clear.  Respiratory effort is nonlabored. Heart:  cardiac exam reveals S1-S2.  No clicks significant murmurs. Abdomen:  abdomen is soft. Extremities:  no peripheral edema. Psych:  patient is oriented to person time and place.  Affect is normal.   Impression & Recommendations:  Problem # 1:  VENTRICULAR ECTOPY (ICD-427.69)  His updated medication list for this problem includes:    Aspirin 81 Mg Tbec (Aspirin) .Marland Kitchen... Take one tablet by mouth  daily    Nitrostat 0.4 Mg Subl (Nitroglycerin) .Marland Kitchen... 1 tablet under tongue at onset of chest pain; you may repeat every 5 minutes for up to 3 doses.    Coreg 12.5 Mg Tabs (Carvedilol) .Marland Kitchen... Take 1 tablet by mouth two times a day The patient is not having any palpitations syncope or presyncope.  No further workup needed.  Problem # 2:  CHRONIC SYSTOLIC HEART FAILURE (123456)  His updated medication list for this problem includes:    Benicar 40 Mg Tabs (Olmesartan medoxomil) .Marland Kitchen... Take 1 tab by mouth at bedtime    Aspirin 81 Mg Tbec (Aspirin) .Marland Kitchen... Take one tablet by mouth daily    Nitrostat 0.4 Mg Subl (Nitroglycerin) .Marland Kitchen... 1 tablet under tongue at onset of chest pain; you may repeat every 5 minutes for up to 3 doses.    Furosemide 40 Mg Tabs (Furosemide) .Marland Kitchen... Take one tablet by mouth once daily    Coreg 12.5 Mg Tabs (Carvedilol) .Marland Kitchen... Take 1 tablet by mouth two times a day We're trying to adjust his medicines appropriately.  During the discussion today he told me several different things about what he was and was not taking.  His daughter help clarify the situation.  I am not totally sure that he is taking all of his medications every day.  We need to try to optimize his meds.  He is to remain on carvedilol 12.5 b.i.d.  He is to restart taking his furosemide 40 mg daily.  Problem # 3:  RENAL INSUFFICIENCY (ICD-588.9) His renal function can be stable with Lasix 40.  Problem # 4:  * ELEVATED POTASSIUM His potassium was up to 5.2.  He is not receiving any extra potassium.  We will cut his banana out of his diet.  He cannot receive spironolactone.  I'll see him back for followup.  Other Orders: EKG w/ Interpretation (93000)  Patient Instructions: 1)  Follow up with Dr. Ron Parker on Wed, Nov 9th. 2)  Take Lasix (furosemide) and Coreg (carvedilol) as directed. Prescriptions: FUROSEMIDE 40 MG TABS (FUROSEMIDE) Take one tablet by mouth once daily  #90 x 3   Entered by:   Gurney Maxin, RN,  BSN   Authorized by:   Lorenza Evangelist, MD, [facility]   Signed by:   Gurney Maxin, RN, BSN on 03/16/2010   Method used:   Electronically to        [facility] (retail)             ,          Ph: [phone]       Fax: [phone]   RxIDIA:[id number] COREG 12.5 MG TABS (CARVEDILOL) Take 1 tablet by mouth two times a day  #180 x 3   Entered by:   Gurney Maxin, RN, BSN   Authorized by:   Lorenza Evangelist, MD, [facility]   Signed by:   Gurney Maxin, RN, BSN on 03/16/2010   Method used:   Electronically to  [facility] (retail)             ,          Ph: [phone]       Fax: [phone]   RxID:   [id number] COREG 12.5 MG TABS (CARVEDILOL) Take 1 tablet by mouth two times a day  #30 x 0   Entered by:   Gurney Maxin, RN, BSN   Authorized by:   Lorenza Evangelist, MD, [facility]   Signed by:   Gurney Maxin, RN, BSN on 03/16/2010   Method used:   Electronically to        [facility] (retail)       [address]       [place], [place]  [address]       Ph: [phone]       Fax: [phone]   RxID:   [id number] FUROSEMIDE 40 MG TABS (FUROSEMIDE) Take one tablet by mouth once daily  #14 x 0   Entered by:   Gurney Maxin, RN, BSN   Authorized by:   Lorenza Evangelist, MD, [facility]   Signed by:   Gurney Maxin, RN, BSN on 03/16/2010   Method used:   Electronically to        [facility] (retail)       [address]       [place], [place]  [address]       Ph: [phone]       Fax: [phone]   RxID:   [id number]

## 2010-07-07 NOTE — Assessment & Plan Note (Signed)
Summary: APPT 1:15PM-ADD ON PER GENE-JM   Visit Type:  Follow-up Primary Provider:  Jerene Bears   History of Present Illness: patient returns for early followup.  I recently referred him for extensive blood work, a 2-D echocardiogram, and subsequent chest x-ray. Labs notable for creatinine 1.6, potassium 4.8, LDL 36, normal TSH, and normal CBC. BNP 1500, and subsequent CXR indicating new, small bilateral pleural effusions.  2-D echo indicated worsening LVEF (EF 20-25%) with severe global HK, mild RV dysfunction, mild to moderate MR, and severe pulmonary hypertension. Prior to that, patient reports no significant worsening since his recent visit. He continues to deny symptoms of orthopnea, PND, dyspnea, or edema. He continues to report no chest pain or tachycardia palpitations.  I increased his Toprol to b.i.d. dosing, and he presents with improved blood pressure. He has also lost 2 pounds, without the aid of a diuretic.  Preventive Screening-Counseling & Management  Alcohol-Tobacco     Smoking Status: quit     Year Quit: 1980  Current Medications (verified): 1)  Benicar 40 Mg Tabs (Olmesartan Medoxomil) .... Take 1 Tab By Mouth At Bedtime 2)  Metoprolol Succinate 50 Mg Xr24h-Tab (Metoprolol Succinate) .... Take 1 Tablet By Mouth Two Times A Day 3)  Glimepiride 4 Mg Tabs (Glimepiride) .... Take 1 Tablet By Mouth Two Times A Day 4)  Multivitamins   Tabs (Multiple Vitamin) .... Take 1 Tablet By Mouth Once A Day 5)  Aspirin 81 Mg Tbec (Aspirin) .... Take One Tablet By Mouth Daily 6)  Metformin Hcl 500 Mg Tabs (Metformin Hcl) .... Take 1 Tablet By Mouth Two Times A Day 7)  Simvastatin 40 Mg Tabs (Simvastatin) .... Take 1 Tab By Mouth At Bedtime 8)  Uroxatral 10 Mg Xr24h-Tab (Alfuzosin Hcl) .... Take 1 Tab By Mouth At Bedtime 9)  Nitrostat 0.4 Mg Subl (Nitroglycerin) .Marland Kitchen.. 1 Tablet Under Tongue At Onset of Chest Pain; You May Repeat Every 5 Minutes For Up To 3 Doses.  Allergies  (verified): 1)  ! * Ivp Dye  Comments:  Nurse/Medical Assistant: The patient's medication list and allergies were reviewed with the patient and were updated in the Medication and Allergy Lists.  Past History:  Past Medical History:  * NIPPLE DISCOMFORT...resolved October, 2010 LEG CRAMPS (ICD-729.82)...nighttime * BURPING AND HICCUPS....question reflux..improved October, 2010 * ACE INHIBITOR.. INTOLERANCE....tolerates ARB GI BLEEDING (ICD-578.9)...lower.. 2002 * CONTRAST DYE ALLERGY RENAL INSUFFICIENCY (ICD-588.9)...mild..creatinine 1.5.. September 19, 2008 DM (ICD-250.00) DYSLIPIDEMIA (ICD-272.4) HYPERTENSION (ICD-401.9) ATRIAL FIBRILLATION, HX OF (ICD-V12.59)...postop CABG...... brief treatment amiodarone... stopped... NSR * EF 40-45%.Marland Kitchenecho... December, 2008... 20-25% by echo, 8/11 Pulmonary hypertension, severe Mitral regurgitation... mild.. echo... 2008... mild/moderate  by echo, 8/11 CORONARY ARTERY BYPASS GRAFT, HX OF (ICD-V45.81)..December, 2008 CAD (ICD-414.00)  Review of Systems       No fevers, chills, hemoptysis, dysphagia, melena, hematocheezia, hematuria, rash, claudication, orthopnea, pnd, pedal edema. All other systems negative.   Vital Signs:  Patient profile:   77 year old male Height:      69 inches Weight:      179 pounds Pulse rate:   64 / minute BP sitting:   167 / 97  (left arm) Cuff size:   regular  Vitals Entered By: Georgina Peer (January 14, 2010 1:47 PM)  Physical Exam  Additional Exam:  GEN:77 year old male, sitting upright, in no distress HEENT: NCAT,PERRLA,EOMI NECK: palpable pulses, no bruits; no JVD; no TM LUNGS: Mild basilar crackles, no wheezes HEART: RRR (S1S2); soft, grade [id number] systolic ejection murmur; positive S4; no rubs ABD:  soft, NT; intact BS EXT: intact distal pulses; trace peripheral edema SKIN: warm, dry; somewhat pallid MUSC: no obvious deformity NEURO: A/O (x3)     EKG  Procedure date:  01/14/2010  Findings:       normal sinus rhythm at 61 bpm; IVCD; no significant change from recent study  Impression & Recommendations:  Problem # 1:  CHRONIC SYSTOLIC HEART FAILURE (123456) recent echocardiogram indicates worsening LV function (EF 20-20%), compared to previous LVEF 40-45%, in 2008. Recent CXR notable for new, small bilateral pleural effusions. BNP level was elevated at 1500. Following consultation with Dr.Katz, recommendation is to add Lasix 40 mg daily. We'll check repeat metabolic profile and BNP level in one week. of note, in the absence of any chest pain, we'll defer ischemic evaluation, at this point in time (i.e. cardiac catheterization). We'll schedule early clinic visit with Dr. Ron Parker in 2 weeks.  Problem # 2:  CORONARY ARTERY BYPASS GRAFT, HX OF (ICD-V45.81) Assessment: Comment Only  Problem # 3:  HYPERTENSION (ICD-401.9)  improved, following recent up titration of Toprol.  Problem # 4:  VENTRICULAR ECTOPY (ICD-427.69)  improved, following recent up titration of Toprol.  Other Orders: EKG w/ Interpretation ([id number]) T-Basic Metabolic Panel (99991111) T-BNP  (B Natriuretic Peptide) [id number])  Patient Instructions: 1)  Start Lasix (furosemide) 40mg  by mouth once daily. 2)  Your physician recommends that you go to the [facility] for lab work: DO IN 1 [facility] 8/17 3)  FOLLOW UP WITH GENE/DR. KATZ ON FRIDAY, January 30, 2010 AT 1:40PM. Prescriptions: FUROSEMIDE 40 MG TABS (FUROSEMIDE) Take one tablet by mouth daily.  #30 x 6   Entered by:   Gurney Maxin, RN, BSN   Authorized by:   Maryjane Hurter, PA-C   Signed by:   Gurney Maxin, RN, BSN on 01/14/2010   Method used:   Electronically to        [facility] (retail)       [address]       [place], [place]  [address]       Ph: [phone]       Fax: [phone]   RxIDNK:[id number]   Handout requested.

## 2010-07-07 NOTE — Assessment & Plan Note (Signed)
Summary: 3-4 WK F/U PER 11/9 OV-JM   Visit Type:  Follow-up Primary Provider:  Jerene Bears  CC:  hypertension.  History of Present Illness: The patient is seen for followup of hypertension and cardiomyopathy.  I saw him last in the office on April 15, 2010.  I've been adjusting medicines for his left ventricular dysfunction.  He continued to have significant hypertension.  I added amlodipine at 5 mg daily.  His pressure improved from 180/100-165/75.  He still therefore has some systolic hypertension.  Because hypertension may be causing his left ventricular dysfunction I will try to bring his pressure down further.  He's feeling well.  Preventive Screening-Counseling & Management  Alcohol-Tobacco     Smoking Status: quit     Year Quit: 1980  Current Medications (verified): 1)  Benicar 40 Mg Tabs (Olmesartan Medoxomil) .... Take 1 Tab By Mouth At Bedtime 2)  Glimepiride 4 Mg Tabs (Glimepiride) .... Take 1 Tablet By Mouth Two Times A Day 3)  Multivitamins   Tabs (Multiple Vitamin) .... Take 1 Tablet By Mouth Once A Day 4)  Aspirin 81 Mg Tbec (Aspirin) .... Take One Tablet By Mouth Daily 5)  Metformin Hcl 500 Mg Tabs (Metformin Hcl) .... Take 2 By Mouth in Morning and 1 in Evening 6)  Simvastatin 40 Mg Tabs (Simvastatin) .... Take 1 Tab By Mouth At Bedtime 7)  Uroxatral 10 Mg Xr24h-Tab (Alfuzosin Hcl) .... Take 1 Tab By Mouth At Bedtime 8)  Nitrostat 0.4 Mg Subl (Nitroglycerin) .Marland Kitchen.. 1 Tablet Under Tongue At Onset of Chest Pain; You May Repeat Every 5 Minutes For Up To 3 Doses. 9)  Furosemide 40 Mg Tabs (Furosemide) .... Take One Tablet By Mouth Once Daily 10)  Coreg 12.5 Mg Tabs (Carvedilol) .... Take 1 Tablet By Mouth Two Times A Day 11)  Amlodipine Besylate 5 Mg Tabs (Amlodipine Besylate) .... Take 1/2 Tablet By Mouth Once Daily For 1 Week and Then Increase To 1 Tablet Once Daily.  Allergies (verified): 1)  ! * Ivp Dye  Comments:  Nurse/Medical Assistant: The patient is currently  on medications but does not know the name or dosage at this time. Instructed to contact our office with details. Will update medication list at that time.  Past History:  Past Medical History: Last updated: 04/15/2010  * NIPPLE DISCOMFORT...resolved October, 2010 LEG CRAMPS (ICD-729.82)...nighttime * BURPING AND HICCUPS....question reflux..improved October, 2010 * ACE INHIBITOR.. INTOLERANCE....tolerates ARB GI BLEEDING (ICD-578.9)...lower.. 2002.. * CONTRAST DYE ALLERGY RENAL INSUFFICIENCY (ICD-588.9)...mild..creatinine 1.5.. September 19, 2008 DM (ICD-250.00) DYSLIPIDEMIA (ICD-272.4) HYPERTENSION (ICD-401.9) ATRIAL FIBRILLATION, HX OF (ICD-V12.59)...postop CABG...... brief treatment amiodarone... stopped... NSR * EF 40-45%.Marland Kitchenecho... December, 2008... 20-25% by echo, 8/11 Pulmonary hypertension, severe Mitral regurgitation... mild.. echo... 2008... mild/moderate  by echo, 8/11 CORONARY ARTERY BYPASS GRAFT, HX OF (ICD-V45.81)..December, 2008 CAD (ICD-414.00)  Review of Systems       Patient denies fever, chills, headache, sweats, rash, change in vision, change in hearing, chest pain, cough, nausea vomiting, urinary symptoms.  All other systems are reviewed and are negative.  Vital Signs:  Patient profile:   77 year old male Height:      69 inches Weight:      178 pounds Pulse rate:   59 / minute BP sitting:   162 / 76  (left arm) Cuff size:   regular  Vitals Entered By: Georgina Peer (May 13, 2010 1:05 PM)  Physical Exam  General:  patient looks good today. Eyes:  there is normal extraocular motion. Neck:  no jugular venous stent. Lungs:  lungs are clear.  Respiratory effort is unlabored. Heart:  cardiac exam reveals S1-S2.  No clicks or significant murmurs. Abdomen:  abdomen is soft. Msk:  no musculoskeletal deformities. Extremities:  no peripheral edema. Psych:  patient is oriented to person time and place.  Affect is normal.   Impression &  Recommendations:  Problem # 1:  CHRONIC SYSTOLIC HEART FAILURE (123456) Heart failure is under control.  No change in therapy  Problem # 2:  HYPERTENSION (ICD-401.9) Blood pressure is much better with the addition of amlodipine.  We will increase the dose to 10 mg daily.  Problem # 3:  CAD (ICD-414.00) Coronary disease is stable.  EKG is done today and reviewed by me.  He is sinus rhythm.  There is old interventricular conduction delay.  No change in therapy.  Other Orders: EKG w/ Interpretation (93000)  Patient Instructions: 1)  FOLLOW UP WITH DR. Nykeria Mealing ON WEDNESDAY FEB. 8, 2O12 AT 8:45 AM. 2)  INCREASE NORVASC (AMLODIPINE) TO 10 MG by mouth once daily. YOU MAY TAKE 2 OF YOUR 5 MG TABLETS UNTIL GONE. Prescriptions: AMLODIPINE BESYLATE 10 MG TABS (AMLODIPINE BESYLATE) Take one tablet by mouth daily  #90 x 3   Entered by:   Gurney Maxin, RN, BSN   Authorized by:   Lorenza Evangelist, MD, [facility]   Signed by:   Gurney Maxin, RN, BSN on 05/13/2010   Method used:   Electronically to        [facility] (retail)             ,          Ph: [phone]       Fax: [phone]   RxIDBO:[id number] AMLODIPINE BESYLATE 10 MG TABS (AMLODIPINE BESYLATE) Take one tablet by mouth daily  #15 x 1   Entered by:   Gurney Maxin, RN, BSN   Authorized by:   Lorenza Evangelist, MD, [facility]   Signed by:   Gurney Maxin, RN, BSN on 05/13/2010   Method used:   Electronically to        [facility] (retail)       [address]       [place], [place]  [address]       Ph: [phone]       Fax: [phone]   RxID:   [id number]

## 2010-07-09 NOTE — Medication Information (Signed)
Summary: RX Folder/ [facility]  RX Folder/ [facility]   Imported By: Bartholomew Boards 06/03/2010 14:55:12  _____________________________________________________________________  External Attachment:    Type:   Image     Comment:   External Document  Appended Document: RX Folder/ [facility] Reduce simva to 20 mg.  Appended Document: RX Folder/ [facility] Patient informed of the above. Nurse informed patient to break his 40mg  in 1/2 and a new rx will be sent to [facility].

## 2010-07-09 NOTE — Progress Notes (Signed)
Summary: SIMVASTATIN DOSE ALREADY 20MG  PER NEICE  Phone Note Call from Patient Call back at Home Phone [phone]   Caller: Patient's neice Call For: nurse Summary of Call: neice called back to inform nurse that Dr. Woody Seller had already changed patient's simvastatin to 20mg . Nurse informed her that in the future if PCP change meds to inform our office so we can update medications also. Initial call taken by: Georgina Peer,  June 12, 2010 9:28 AM

## 2010-08-10 ENCOUNTER — Encounter: Payer: Self-pay | Admitting: Cardiology

## 2010-08-10 ENCOUNTER — Ambulatory Visit (INDEPENDENT_AMBULATORY_CARE_PROVIDER_SITE_OTHER): Payer: Medicare Other | Admitting: Cardiology

## 2010-08-10 DIAGNOSIS — I428 Other cardiomyopathies: Secondary | ICD-10-CM

## 2010-08-18 ENCOUNTER — Encounter: Payer: Self-pay | Admitting: *Deleted

## 2010-08-18 NOTE — Assessment & Plan Note (Signed)
Summary: 2 MO F/U PER 12/7 OV-JM/TR/ Need afternoon appt.   Visit Type:  Follow-up Primary Provider:  Jerene Bears  CC:  ischemic cardiomyopathy.  History of Present Illness: The patient is seen for followup of his ischemic cardiomyopathy.  I saw him last May 13, 2010.  We pushed his amlodipine dose from 5-10 at that time.  He is getting better blood pressure control.  His pulse remains at 72 and it appears that we can push his carvedilol dose little higher.  He's feeling well.  He is not having any chest pain or signs of heart.  He has not had syncope or presyncope.  Preventive Screening-Counseling & Management  Alcohol-Tobacco     Smoking Status: quit     Year Quit: 30 yrs ago  Current Medications (verified): 1)  Benicar 40 Mg Tabs (Olmesartan Medoxomil) .... Take 1 Tab By Mouth At Bedtime 2)  Glimepiride 4 Mg Tabs (Glimepiride) .... Take 1 Tablet By Mouth Two Times A Day 3)  Multivitamins   Tabs (Multiple Vitamin) .... Take 1 Tablet By Mouth Once A Day 4)  Aspirin 81 Mg Tbec (Aspirin) .... Take One Tablet By Mouth Daily 5)  Metformin Hcl 500 Mg Tabs (Metformin Hcl) .... Take 2 Tabs Two Times A Day 6)  Simvastatin 20 Mg Tabs (Simvastatin) .... Take 1 Tablet By Mouth Once A Day 7)  Uroxatral 10 Mg Xr24h-Tab (Alfuzosin Hcl) .... Take 1 Tab By Mouth At Bedtime 8)  Nitrostat 0.4 Mg Subl (Nitroglycerin) .Marland Kitchen.. 1 Tablet Under Tongue At Onset of Chest Pain; You May Repeat Every 5 Minutes For Up To 3 Doses. 9)  Furosemide 40 Mg Tabs (Furosemide) .... Take One Tablet By Mouth Once Daily 10)  Coreg 12.5 Mg Tabs (Carvedilol) .... Take 1 Tablet By Mouth Two Times A Day 11)  Amlodipine Besylate 10 Mg Tabs (Amlodipine Besylate) .... Take One Tablet By Mouth Daily  Allergies (verified): 1)  ! * Ivp Dye  Past History:  Past Medical History:  * NIPPLE DISCOMFORT...resolved October, 2010.Marland Kitchen LEG CRAMPS (ICD-729.82)...nighttime * BURPING AND HICCUPS....question reflux..improved October, 2010 *  ACE INHIBITOR.. INTOLERANCE....tolerates ARB GI BLEEDING (ICD-578.9)...lower.. 2002.. * CONTRAST DYE ALLERGY RENAL INSUFFICIENCY (ICD-588.9)...mild..creatinine 1.5.. September 19, 2008 DM (ICD-250.00) DYSLIPIDEMIA (ICD-272.4) HYPERTENSION (ICD-401.9) ATRIAL FIBRILLATION, HX OF (ICD-V12.59)...postop CABG...... brief treatment amiodarone... stopped... NSR * EF 40-45%.Marland Kitchenecho... December, 2008... 20-25% by echo, 8/11 Pulmonary hypertension, severe Mitral regurgitation... mild.. echo... 2008... mild/moderate  by echo, 8/11 CORONARY ARTERY BYPASS GRAFT, HX OF (ICD-V45.81)..December, 2008 CAD (ICD-414.00)  Review of Systems       The patient denies fever, chills, headache, sweats, rash, change in vision, change in hearing, chest pain, cough, nausea vomiting, urinary symptoms.  All other systems are reviewed and are negative.  Vital Signs:  Patient profile:   77 year old male Weight:      177 pounds Pulse rate:   72 / minute BP sitting:   138 / 70  (left arm) Cuff size:   regular  Vitals Entered By: Lovina Reach, LPN (March  5, [id number] PM) CC: ischemic cardiomyopathy Is Patient Diabetic? Yes   Physical Exam  General:  patient is stable. Head:  head is atraumatic. Eyes:  no xanthelasma. Neck:  no jugular venous distention. Lungs:  lungs are clear.  Respiratory effort is nonlabored. Heart:  cardiac exam reveals S1-S2.  No clicks or significant murmurs. Abdomen:  abdomen is soft. Extremities:  no peripheral edema. Psych:  patient is oriented to time and place.  Affect is normal.  Impression & Recommendations:  Problem # 1:  HYPERTENSION (ICD-401.9) Blood pressure is under much better control.  We will watch it as I increase the carvedilol dose.  Problem # 2:  CHRONIC SYSTOLIC HEART FAILURE (123456) carvedilol dose will be increased today.  After this change we will watch the patient and then proceed with followup 2-D echo.  Problem # 3:  ATRIAL FIBRILLATION, HX OF  (ICD-V12.59) EKG is done today and reviewed by me.  He has normal sinus rhythm.  Other Orders: EKG w/ Interpretation (93000)  Patient Instructions: 1)  Follow up with Dr. Ron Parker on  [facility], September 14, 2010 AT 1:15PM. 2)  Increase Carvedilol to 25mg  by mouth two times a day. This will be 2 of our 12.5 mg tablets until finished and then get new prescription filled for 25mg  tablets.  Prescriptions: CARVEDILOL 25 MG TABS (CARVEDILOL) Take one tablet by mouth twice a day  #60 x 6   Entered by:   Gurney Maxin, RN, BSN   Authorized by:   Lorenza Evangelist, MD, [facility]   Signed by:   Gurney Maxin, RN, BSN on 08/10/2010   Method used:   Electronically to        [facility] (retail)       [address]       [place], [place]  [address]       Ph: [phone]       Fax: [phone]   RxID:   [id number]

## 2010-09-09 ENCOUNTER — Other Ambulatory Visit: Payer: Self-pay | Admitting: *Deleted

## 2010-09-09 MED ORDER — CARVEDILOL 25 MG PO TABS: 25.0000 mg | ORAL_TABLET | Freq: Two times a day (BID) | ORAL | Status: DC

## 2010-09-14 ENCOUNTER — Encounter: Payer: Self-pay | Admitting: Cardiology

## 2010-09-14 ENCOUNTER — Ambulatory Visit (INDEPENDENT_AMBULATORY_CARE_PROVIDER_SITE_OTHER): Payer: Medicare Other | Admitting: Cardiology

## 2010-09-14 DIAGNOSIS — Z789 Other specified health status: Secondary | ICD-10-CM | POA: Insufficient documentation

## 2010-09-14 DIAGNOSIS — Z8679 Personal history of other diseases of the circulatory system: Secondary | ICD-10-CM

## 2010-09-14 DIAGNOSIS — I10 Essential (primary) hypertension: Secondary | ICD-10-CM

## 2010-09-14 DIAGNOSIS — K922 Gastrointestinal hemorrhage, unspecified: Secondary | ICD-10-CM | POA: Insufficient documentation

## 2010-09-14 DIAGNOSIS — I429 Cardiomyopathy, unspecified: Secondary | ICD-10-CM | POA: Insufficient documentation

## 2010-09-14 DIAGNOSIS — I428 Other cardiomyopathies: Secondary | ICD-10-CM

## 2010-09-14 DIAGNOSIS — I2510 Atherosclerotic heart disease of native coronary artery without angina pectoris: Secondary | ICD-10-CM | POA: Insufficient documentation

## 2010-09-14 DIAGNOSIS — I340 Nonrheumatic mitral (valve) insufficiency: Secondary | ICD-10-CM | POA: Insufficient documentation

## 2010-09-14 DIAGNOSIS — R252 Cramp and spasm: Secondary | ICD-10-CM | POA: Insufficient documentation

## 2010-09-14 DIAGNOSIS — S4990XA Unspecified injury of shoulder and upper arm, unspecified arm, initial encounter: Secondary | ICD-10-CM | POA: Insufficient documentation

## 2010-09-14 MED ORDER — CARVEDILOL 25 MG PO TABS: 25.0000 mg | ORAL_TABLET | Freq: Two times a day (BID) | ORAL | Status: DC

## 2010-09-14 NOTE — Progress Notes (Signed)
HPI The patient is seen for ongoing followup of cardiomyopathy.  He continues to do very well.  Most recently I increased his carvedilol to 25 mg b.i.d.  He is tolerating this dose.  He did have a fall that was not syncopal.  Overall he is doing very well.  Allergies  Allergen Reactions  . Iodinated Diagnostic Agents Rash    Current Outpatient Prescriptions  Medication Sig Dispense Refill  . alfuzosin (UROXATRAL) 10 MG 24 hr tablet Take 10 mg by mouth daily.        Marland Kitchen amLODipine (NORVASC) 10 MG tablet Take 10 mg by mouth daily.        Marland Kitchen aspirin 81 MG tablet Take 81 mg by mouth daily.        . carvedilol (COREG) 25 MG tablet Take 1 tablet (25 mg total) by mouth 2 (two) times daily with a meal.  180 tablet  2  . furosemide (LASIX) 40 MG tablet Take 40 mg by mouth daily.        Marland Kitchen glimepiride (AMARYL) 4 MG tablet Take 4 mg by mouth 2 (two) times daily.        . metFORMIN (GLUCOPHAGE) 500 MG tablet Take 1,000 mg by mouth 2 (two) times daily with a meal.       . Multiple Vitamins-Minerals (MULTIVITAMIN WITH MINERALS) tablet Take 1 tablet by mouth daily.        . nitroGLYCERIN (NITROSTAT) 0.4 MG SL tablet Place 0.4 mg under the tongue every 5 (five) minutes as needed.        Marland Kitchen olmesartan (BENICAR) 40 MG tablet Take 40 mg by mouth daily.        . simvastatin (ZOCOR) 20 MG tablet Take 20 mg by mouth at bedtime.          History   Social History  . Marital Status: Married    Spouse Name: N/A    Number of Children: N/A  . Years of Education: N/A   Occupational History  . Not on file.   Social History Main Topics  . Smoking status: Former Smoker    Quit date: 06/07/1978  . Smokeless tobacco: Not on file  . Alcohol Use: No  . Drug Use: No  . Sexually Active: Not on file   Other Topics Concern  . Not on file   Social History Narrative  . No narrative on file    No family history on file.  Past Medical History  Diagnosis Date  . Nipple pain     resolved october 2010  . Leg  cramps     night time  . Burping     with hiccups ...question reflux... improved 03/2009  . ACE inhibitor intolerance     ...tolerates ARB  . GI bleeding     ...lower...2002  . Allergic reaction to contrast dye   . Renal insufficiency april 15,2010    ...mild...creatine 1.5.Marland Kitchen  Marland Kitchen Diabetes mellitus   . Dyslipidemia   . Hypertension   . Atrial fibrillation     atrial fibrillation ..postopCABG....brief treatmentamiodarone...stopped.Marland Kitchen.NSR  . Ejection fraction < 50%     EF 40-45%.Marland Kitchenecho...december 2008...20-25%by echo 8/2011pulmonary hypertensio,severe  . Mitral regurgitation     ...mild..echo...2008...mild/moderate by echo 01/2010  . CAD (coronary artery disease)     EF 40-45%, December, 2008  /  ejection fraction 20-25%, echo, August, 2011  . CHF (congestive heart failure)     Chronic systolic  . Cardiomyopathy     EF was noted to  be worse August, 2011.  No ischemic workup done at that time.  Decision made to treat LV dysfunction and followup and make other decisions later  . Shoulder injury     Patient fell (not syncope) April, 2012    Past Surgical History  Procedure Date  . Coronary artery bypass graft     december 2008    ROS  Patient denies fever, chills, headache, sweats, rash, change in vision, change in hearing, chest pain, cough, nausea vomiting, urinary symptoms.  All other systems are reviewed and are negative.   PHYSICAL EXAM Patient looks quite good today.  He is oriented to person time and place.  Affect is normal.  He had some soreness in his right arm.  There is no jugular venous distention.  Head is atraumatic.  Lungs are clear.  Respiratory effort is nonlabored.  Cardiac exam reveals an S1-S2.  There is a soft systolic murmur.  The abdomen is soft.  No significant peripheral edema.   Filed Vitals:   09/14/10 1327 09/14/10 1332  BP:  156/73  Pulse:  53  Height: 5\' 6"  (1.676 m)   Weight: 172 lb (78.019 kg)     EKG EKG is done today and reviewed by me.(Is  not charged because he had an EKG at the time of his last visit.) however his EKG today does show sinus rhythm.  ASSESSMENT & PLAN

## 2010-09-14 NOTE — Patient Instructions (Signed)
Your physician recommends that you continue on your current medications as directed. Please refer to the Current Medication list given to you today.  Follow up as scheduled  

## 2010-09-14 NOTE — Assessment & Plan Note (Signed)
Overall his cardiomyopathy is stable.  I will keep him on his current dose of medicines with no change today.  He'll then see him back in approximately 5 weeks.  If he is stable at that time we will proceed with repeat 2-D echo to see if his LV has improved.

## 2010-09-14 NOTE — Assessment & Plan Note (Signed)
He continues to hold sinus rhythm.  No change in therapy.

## 2010-09-14 NOTE — Assessment & Plan Note (Signed)
Blood pressures are not like to see today.  However he is on many medications and I feel it would not be prudent to change his meds further today.

## 2010-09-15 ENCOUNTER — Encounter: Payer: Self-pay | Admitting: Cardiology

## 2010-09-28 LAB — HEPATIC FUNCTION PANEL
ALT: 26 U/L (ref 10–40)
AST: 40 U/L (ref 14–40)

## 2010-10-02 DIAGNOSIS — I2510 Atherosclerotic heart disease of native coronary artery without angina pectoris: Secondary | ICD-10-CM

## 2010-10-20 NOTE — Assessment & Plan Note (Signed)
[place] OFFICE NOTE   NAME:WYRICKTyjohn, Masaki                       MRN:          [id number]  DATE:09/16/2008                            DOB:          [birth year]    Mr. Burdett is doing great.   He is not having any chest pain.  He underwent CABG in December 2008.  He is taking his medications.  He is going about full activity.  He on  his own has lost from 193-177.  His blood pressure at home was under  good control.  His systolic is mildly elevated today.  He has not had  any syncope or presyncope.   He has not had any palpitations.   PAST MEDICAL HISTORY:  Allergies:  IV DYE.   MEDICATIONS:  See the flow sheet.   REVIEW OF SYSTEMS:  He has no GI or GU symptoms.  He has no fevers or  chills or skin rashes.  There is no headache.  He has no visual change  and no hearing change.  There is no cough.  There is no GI or GU  symptoms.  There is no swelling.  He has no major musculoskeletal  problems.  All other systems are reviewed and are negative.   PHYSICAL EXAMINATION:  VITAL SIGNS:  Blood pressure 150/79 with a pulse  of 63.  Weight is 177.  GENERAL:  The patient is oriented to person, time, and place.  He is  here with his niece today.  HEENT:  No xanthelasma.  He has normal extraocular motion.  NECK:  There are no carotid bruits.  There is no jugular venous  distention.  LUNGS:  Clear.  Respiratory effort is not labored.  CARDIAC:  S1 with an S2.  There are no clicks or significant murmurs.  ABDOMEN:  Soft.  He has no significant peripheral edema.   EKG today reveals sinus rhythm.  He has evidence of an old decreased  anterior R-wave.   Problems are listed completely on the note of March 21, 2008.  He is  stable post CABG and he has only mild left ventricular dysfunction.  He  has no evidence of recurrent atrial fibrillation.  His blood pressure is  slightly higher than I would like today, but  he insists that it is under  control at home.  He will follow up with Dr. Woody Seller concerning his other  issues.  I will see him back in 6  months for followup.  It is of note that his hiccups are improved and  his leg cramps are improved and the discomfort in his nipple has  improved.     Carlena Bjornstad, MD, [facility]  Electronically Signed    JDK/MedQ  DD: 09/16/2008  DT: 09/17/2008  Job #: [id number]   cc:   Jerene Bears, MD

## 2010-10-20 NOTE — Assessment & Plan Note (Signed)
[place] OFFICE NOTE   NAME:[name], [name]                       MRN:          [id number]  DATE:06/29/2007                            DOB:          10/26/1927    John Moore is seen for cardiology continued care.  He is doing very  well.  He is here with his wife and several daughters.  He is a very  attentive and active gentleman and has a very supportive and caring  family.  I had seen him in the office in [place], and we were quite  concerned about his symptoms.  They seem to have a crescendo pattern.  He was sent to [facility].  He was cath'd the following day and went directly  to surgery.  He did very well.  He had urgent CABG x4 with a LIMA to the  LAD, vein graft to the first diagonal, vein graft to the OM, and a vein  graft to distal right coronary artery.  Afterwards, he stabilized.  He  did have some phlebitis in his right forearm.  He had some volume  overload which is improved.  He did have some renal dysfunction that  improved and he also had postoperative atrial fib.  He was treated with  amiodarone and he is in sinus rhythm at this time.  Today he returns for  follow-up.  He is doing very well.  When he first went home, his weight  was in the range of 187 and he has diuresed down to approximately 172.  He is feeling stronger every day.  He does have some mild swelling in  his right leg and in particular near the vein harvest site.  There is no  heat or redness or pain.  We know from his catheterization that he did  have some anterior apical hypokinesis and his ejection fraction was in  the 40-45% range.   PAST MEDICAL HISTORY:   ALLERGIES:  No known drug allergies.   MEDICATIONS:  Multivitamin, fish oil, Uroxatral, furosemide 40 (to be  changed to 20 today),  amiodarone 200 mg (to be stopped today),  metoprolol 12.5 mg daily, simvastatin 40, aspirin 325, next citalopram,  next KCl 20 (to be changed  to 10), and next glimepiride 2 mg daily.   OTHER MEDICAL PROBLEMS:  See the list below.   REVIEW OF SYSTEMS:  Other than the HPI.  His review of systems is  negative.  He is quite pleased with his outcome.  He is not having the  chest tightness that he had before.  He is having some soreness in his  chest and he understands that this is a operative issue and that it will  stabilize over time.   PHYSICAL EXAM:  Blood pressure 143/76 with a pulse of 65.  The patient is oriented to person, time and place.  Affect is normal.  His wife and daughters are in the room at that time and evaluation.  HEENT:  Reveals no xanthelasma.  Normal extraocular motion.  There are no carotid bruits.  There is no jugular  venous distention.  Lungs are clear.  Respiratory effort is not labored.  His median  sternotomy site and drain sites are stable.  Cardiac exam reveals S1-S2.  There are no clicks or significant murmurs.  The abdomen is soft.  He has no masses or bruits.  There is no edema in his left leg.  He does have trace edema in his  right leg.  In addition, there is an area of localized swelling in one  of the vein harvest sites but it is not red and there is no heat or  pain.  I believe it is stable.   EKG reveals that he is maintaining a sinus rhythm and he has evidence of  a septal infarct.   PROBLEMS:  1. Anteroapical hypokinesis with ejection fraction in the 40-45%      range.  2. Status post tonsillectomy, ear surgery, rotator cuff repair.  3. History of anxiety.  It seems that some Celexa was started while in      the hospital.  I do not believe he was on this before the hospital.      His family has asked about this and it is okay with me for the      Celexa to be stopped.  4. History of nephrolithiasis.  5. History of the lower gastrointestinal bleed in 2002 secondary to      presumed diverticular disease.  6. Creatinine in the 1.6 or 1.7 range.  7. Benign prostatic hypertrophy.  8.  Hyperlipidemia treated.  9. Hypertension treated.  10.Diabetes, treated.  11.Postoperative atrial fibrillation.  It is now time to stop his      amiodarone.  12.Volume overload.  He continues to diurese.  He will have a BMET      today.  His Lasix will be cut back to 20 daily and potassium back      10 mg daily.  13.Status post phlebitis in the right forearm recovered.  14.Coronary disease.  He is now post CABG x4 as outlined above.   Overall he is stable.  We will make the changes as outlined above.  I  will see him back in 3-4 weeks in the [facility].     John Bjornstad, MD, [facility]  Electronically Signed    JDK/MedQ  DD: 06/29/2007  DT: 06/29/2007  Job #: [id number]   cc:   Revonda Standard. Roxan Hockey, M.D.  Jerene Bears, MD

## 2010-10-20 NOTE — Discharge Summary (Signed)
NAMEDESTAN, CONVERSE NO.:  [id number]   MEDICAL RECORD NO.:  [id number]          PATIENT TYPE:  INP   LOCATION:  2015                         FACILITY:  [facility]   PHYSICIAN:  Revonda Standard. Roxan Hockey, M.D.DATE OF BIRTH:  1927-08-14   DATE OF ADMISSION:  05/29/2007  DATE OF DISCHARGE:                               DISCHARGE SUMMARY   FINAL DIAGNOSIS:  Critical three-vessel coronary artery disease with  post infarction unstable angina.   IN-HOSPITAL DIAGNOSES:  1. Phlebitis, right forearm postoperatively.  2. Volume overload postoperatively.  3. Acute blood loss anemia postoperatively.  4. Acute renal failure postoperatively.  5. Postoperative atrial fibrillation.   SECONDARY DIAGNOSES:  1. Diabetes mellitus.  2. Hypertension.  3. Hyperlipidemia.  4. Benign prostatic hypertrophy.  5. History of lower gastrointestinal bleeding in 2002, secondary to      presumed diverticular disease.  6. History of nephrolithiasis.  7. History of anxiety.  8. Status post rotator cuff repair.  9. Status post tonsillectomy.  10.Status post ear surgery in the past.   IN-HOSPITAL OPERATIONS AND PROCEDURES:  1. Cardiac catheterization.  2. Urgent coronary artery bypass grafting x4 using a left internal      mammary artery to left anterior descending, a saphenous vein graft      to first diagonal, saphenous vein graft to obtuse marginal one,      saphenous vein graft to distal right coronary artery.  3. Endoscopic vein harvest from right leg.   HISTORY AND PHYSICAL/HOSPITAL COURSE:  Mr. Zenk is a 77 year old  gentleman who presented with chest pain.  He ruled in for a myocardial  infarction.  The patient underwent cardiac catheterization, which  revealed severe three-vessel coronary artery disease.  The patient had  post infarct angina during the catheterization procedure but did  stabilize.  Given the critical nature of his coronary artery disease as  well as unstable pain,  the patient was advised to undergo urgent  coronary artery bypass grafting.  Dr. Roxan Hockey saw and discussed with  the patient risks and benefits.  The patient acknowledged understanding  and agreed to proceed.  He was scheduled for surgery for May 30, 2007 urgently.  For details of the patient's past medical history and  physical exam, please see dictated H&P.  The patient did have  preoperative carotid duplex ultrasound preoperatively showing no  evidence of ICA stenosis.   The patient was taken urgently to the operating room on May 30, 2007 where he underwent an urgent coronary artery bypass grafting x4  using a left internal mammary artery to left anterior descending,  saphenous vein graft to first diagonal, saphenous vein graft to obtuse  marginal one, and saphenous vein graft to distal right coronary.  Endoscopic vein harvest from right leg was done.  The patient tolerated  this procedure well and was transferred to the intensive care unit in  stable condition.  Postoperatively, the patient was noted to be  hemodynamically stable.  He was able to be extubated the morning of  postoperative day #1.  Post extubation, the patient alert and oriented  x4, neurologically intact.  Postoperatively, the patient was noted to be  hypotensive.  He required Neo-Synephrine drip as well as dopamine and  lidocaine.  He was placed on A pacing at 80.  Drips were able to be  weaned off and discontinued.  The patient's blood pressure was stable  off the drips.  Unfortunately, the patient went into rapid atrial  fibrillation on postoperative day #3.  He was started on IV amiodarone.  The patient's A pacing was discontinued at this time.  With initiation  of IV amiodarone, the patient was able to convert back to normal sinus  rhythm.  Postoperative day #4, the patient was able to be switched from  IV amiodarone to p.o. amiodarone.  He did remain in normal sinus rhythm  during this time.   Blood pressure and heart rate were continued to be  followed slowly.  He was able to be started on a low-dose beta blocker.  The patient was stable on this medication.  Prior to discharge home, he  was noted to be in normal sinus rhythm, and his beta blocker was  switched to Toprol XL 12.5 mg daily.  The patient tolerated this well.  Postoperatively, the patient was able to be extubated without  difficulty.  He was placed on nasal cannula.  He was encouraged to use  his incentive spirometer postoperatively.  Postoperative chest x-rays  were stable, and chest tubes were able to be discontinued in a normal  fashion.  Follow-up chest x-rays remained stable.  He was eventually  able to be weaned off oxygen, saturating greater than 90% on room air.  Postoperatively, the patient did have significant volume overload.  He  was started on IV Lasix on the intensive care unit.  His volume overload  improved slowly.  BNP was checked for further evaluation and noted to be  elevated of 2,106 on postoperative day #6.  Diuretics were continued.  Eventually switched to p.o. Lasix b.i.d.  Echocardiogram was ordered and  done December 31 showing ejection fraction 40 to 45% with mild mitral  regurgitation.  His volume overload had started to improve a little bit  more.  Diuretics were switched to daily.  Postoperatively, the patient's  creatinine was followed.  It was noted his creatinine was elevating  slightly over his postoperative course.  It went up to 1.7.  The patient  had no history of renal insufficiency.  Creatinine was followed closely  and continued to go up further to 1.83.  It was noted the patient was on  significant doses of diuretics for his volume overload.  Creatinine  continued to be monitored and started to trend down back towards  baseline.  By January 1, creatinine was at 1.7, and the diuretics were  discontinued to daily at this time.  He will be followed up in a.m.  prior to  discharge.  Postoperatively, the patient did have slight acute  blood loss anemia.  He did not require any postoperative transfusions.  It was followed closely.  The patient's hemoglobin and hematocrit  remained stable.  The patient does have a history of diabetes mellitus.  His blood sugars were followed closely.  He was initially restarted on  his Amaryl 4 mg daily.  This was held when his creatinine started to  increase further.  Amaryl was restarted prior to discharge home at a  lower dose at 2 mg daily.  He will continue to follow up prior to  discharge.  Blood sugars were  stable.  Postoperatively, cardiac  rehabilitation was working with the patient.  His mobility continued to  increase daily.  He was ambulating well with a walker prior to  discharge.  The patient was tolerating diet postoperatively.  No nausea  or vomiting noted.   Vitals on postoperative day #9 were stable.  The patient was afebrile.  He was saturating greater than 90% on room air.  He was 9 kg above his  preoperative weight.  There is noted to be a normal sinus rhythm.  Pulmonary status was stable.  All incisions were healing well clean,  dry, and intact.  BMP showed sodium of 133, potassium of 4.8, chloride  of 100, bicarbonate of 24, BUN of 32, creatinine of 1.7, glucose of 72.  CBC on postoperative day #7 showed a white count of 10.3, hemoglobin of  9.5, hematocrit of 26.9, platelet count of 288.   The patient is tentatively ready for discharge home in the a.m., June 09, 2007.   FOLLOW-UP APPOINTMENTS:  Follow-up appointment will be arranged with Dr.  Roxan Hockey for 3 weeks.  Our office will contact the patient with this  information.  The patient will need to obtain chest x-ray 30 minutes  prior to his appointment.  The patient will need to follow up with Dr.  Ron Parker in 2 weeks.  He will need to contact this office to make these  arrangements.   ACTIVITY:  The patient instructed no driving until  released to do so.  No heavy lifting over 10 pounds.  He was told to ambulate 3 to 4 times  per day, progress as tolerated, and continue his breathing exercises.   INCISIONAL CARE:  The patient was told to shower, washing his incisions  using soap and water.  He is to contact the office if he develops any  drainage or opening from any of his incision sites.   DIET:  The patient educated on diet to be low-fat, low-salt as well as  carbohydrate-modified medium calorie diet.   DISCHARGE MEDICATIONS:  1. Multivitamin daily.  2. Fish oil 1,000 mg daily.  3. Aspirin 325 mg daily.  4. Toprol XL 25 mg one half tablet daily.  5. Zocor 40 mg at night.  6. Xanax 0.25 mg p.r.n.  7. Amiodarone 200 mg b.i.d. x7 days; then 200 mg daily.  8. UroXatral 10 mg at night.  9. Amaryl 2 mg daily.  10.Celexa 10 mg daily.  11.Lasix 40 mg daily.  12.Potassium chloride 20 mEq every other day.  13.Oxycodone 5 mg 1-2 tablets q. 4-6 h. p.r.n. pain.      Darlin Coco, PA      Revonda Standard. Roxan Hockey, M.D.  Electronically Signed    KMD/MEDQ  D:  06/08/2007  T:  06/08/2007  Job:  [id number]   cc:   Revonda Standard. Roxan Hockey, M.D.  Carlena Bjornstad, MD, [facility]

## 2010-10-20 NOTE — Assessment & Plan Note (Signed)
[place] OFFICE NOTE   DECOTA, RUDGE                       MRN:          [id number]  DATE:05/29/2007                            DOB:          April 26, 1928    REFERRING PHYSICIAN:  Jerene Bears, MD   REASON FOR REFERRAL:  Chest pain.   HISTORY OF PRESENT ILLNESS:  [name] is a 77 year old male patient  who was referred to [place] today by Dr. Woody Seller for cardiac evaluation  regarding chest pain.   The patient began to note exertional chest tightness in approximately  April of this year.  He was referred for outpatient stress testing.  He  had an adenosine nuclear study done on September 27, 2006, that revealed no  ischemia.  There was a small inferobasal defect.  Prior small scar or  infarct could not be ruled out and this was felt to be a low risk study.   Since the patient's negative stress test, he has continued to have  exertional chest discomfort.  He has noted worsening chest discomfort  with less activity.  He notes some mild associated shortness of breath  as well as left arm pain.  He denies any associated nausea or  diaphoresis.  He denies any syncope or near syncope.  He, last night for  the first time, noticed chest discomfort at rest.  This was present when  he awoke in the middle of the night as well as when he awoke in the  morning.  His pain eventually subsided and he is currently pain-free in  the office.   CURRENT MEDICATIONS:  1. Multivitamin daily.  2. Diltiazem XT 180 mg daily.  3. Aspirin 81 mg daily.  4. Benicar 40 mg daily.  5. Fish oil 1 g daily.  6. Uroxatral 10 mg nightly.  7. Glimepiride 4 mg daily.  8. Alprazolam 0.25 mg daily.   ALLERGIES:  IV DYE allergy which causes a rash.   PAST MEDICAL HISTORY:  1. Diabetes mellitus for 20+ years.  2. Hypertension for 20+ years.  3. Hyperlipidemia.      a.     He was previously on Zocor.  Follow-up lipid panel was       normal and  he stopped the medication on his own.  4. Benign prostatic hypertrophy.  5. History of lower GI bleeding in 2002 secondary to presumed      diverticular disease.      a.     The patient notes last colonoscopy done approximately three       years ago.  6. History of nephrolithiasis.  7. History of anxiety.  8. Status post  rotator cuff repair.  9. Status post tonsillectomy.  10.Status post ear surgery in the past.   SOCIAL HISTORY:  He is an ex-smoker.  He quit in 1985 after a 30-pack-  year history.  Denies alcohol or drug abuse.  He is retired from [employer].  He is married and has one child.  His wife has recently been ill which  has created increased stress.   FAMILY HISTORY:  Significant for coronary artery disease.  His mother  died in her late 21s from myocardial infarction.   REVIEW OF SYSTEMS:  Please see HPI.  He has had recent dyspepsia as well  as dysphagia.  He denies odynophagia.  He notes that his chest  discomfort from dyspepsia is very different from his exertional chest  discomfort.  Denies any melena or hematochezia.  Denies any hematuria or  dysuria.  Denies any monocular blindness.  unilateral weakness,  difficulty with speech or facial droop.  Denies any claudication  symptoms.  Denies any fevers, chills or cough.  Rest of the review of  systems are negative.   PHYSICAL EXAMINATION:  VITAL SIGNS:  Blood pressure 151/80, pulse 77,  weight 188 pounds.  GENERAL APPEARANCE:  He is a well-nourished, well-developed male in no  acute distress.  HEENT:  Normal.  NECK:  Without JVD at 45 degrees.  ENDOCRINE:  Without thyromegaly.  LYMPHS:  Without lymphadenopathy.  LUNGS:  Clear to auscultation bilaterally without wheezing, rhonchi or  rales.  CARDIAC:  Normal S1 and S2.  Distant heart sounds, regular rate and  rhythm, a soft 2/6 systolic ejection murmur heard best along the left  sternal border.  ABDOMEN:  Soft and nontender with normo-active bowel sounds, no   organomegaly, no bruits.  EXTREMITIES:  Without edema.  Calves are soft and nontender.  SKIN:  Warm and dry.  NEUROLOGIC:  He is alert and oriented x3.  Cranial nerves II-XII grossly  intact.  VASCULAR:  Without carotid bruits bilaterally, femoral artery pulses 2+  bilaterally without bruits.  Distal pulses are diminished but intact.   Electrocardiogram reveals sinus rhythm with a heart rate of 71, left  axis deviation, left anterior fascicular block, T-wave inversions in aVL  and V4-V6.  No significant change when compared to tracing from Dr.  Woody Seller' office dated May 09, 2007.   IMPRESSION:  1. Unstable angina pectoris.  2. Diabetes mellitus for 20+ years.  3. Hypertension for 20+ years.  4. Hyperlipidemia.      a.     No therapy at present.  5. IV dye allergy.  6. History of lower gastrointestinal bleed secondary to diverticular      disease.  7. Ex-smoker.  8. Remote family history of coronary artery disease.  9. Benign prostatic hypertrophy.  10.Probable gastroesophageal reflux disease.   PLAN:  The patient was also interviewed and examined by Dr. Ron Parker.  Given  his progressively worsening exertional chest discomfort and rest chest  discomfort within the last 24 hours, we think it best to proceed  directly to cardiac catheterization.  We also feel that the patient  should be monitored overnight in the hospital.  Therefore, he will be  admitted to [facility], [facility] today.  He is currently pain-  free and stable.  His family plans to take him directly to [facility] today after leaving the office.   He will be placed on IV heparin.  This will be held if his hemoglobin is  low and further work-up will be pursued if that is the case.  He will be  kept on aspirin.  Imdur will be added to his medical regimen.  Will also  add back Statin with Lipitor 20 mg daily.   If he should need PCI, hopefully a bare metal stent can be used  secondary  to his history of GI bleeding in the past.   He will be pretreated with  prednisone, famotidine and diphenhydramine  for his IV dye allergy.   Will add Protonix 40 mg a day to cover him for GERD as well as GI  prophylaxis.   Risks and benefits of cardiac catheterization have been explained to the  patient, his niece and his wife and they all agree to proceed.      Richardson Dopp, PA-C  Electronically Signed      Carlena Bjornstad, MD, [facility]  Electronically Signed   SW/MedQ  DD: 05/29/2007  DT: 05/29/2007  Job #: [id number]

## 2010-10-20 NOTE — Assessment & Plan Note (Signed)
[facility] OFFICE NOTE   NAME:John Moore, John Moore                       MRN:          [id number]  DATE:08/08/2007                            DOB:          14-Jan-1928    [name] is seen for cardiology follow-up.  I had actually seen him in  the [place] office on June 29, 2007, but dictated into the [facility]  chart and therefore it says [facility] office visit.  A copy was  sent to Dr. Woody Seller and Dr. Roxan Hockey.  Since that time he continues to  do very well.  I have kept him on a low dose of Lasix.  This can now be  stopped.  I also had stopped his amiodarone when he was here and he is  remaining in sinus rhythm by physical exam.  He is feeling well.  He is  not having any chest pain.  He is at cardiac rehab.  He is going about  full activities.  His weight has remained stable at home.  He is here  with his wife and his daughter today.  He has seen Dr. Roxan Hockey and  been released.  Overall he is doing very well.   PAST MEDICAL HISTORY:   ALLERGIES:  No known drug allergies.   MEDICATIONS:  Fish oil, Uroxatral, glimepiride, potassium 20 mEq every  other day (to be stopped today).  Next aspirin 325, next Lasix 20 daily  (to be stopped today), next Toprol 12.5 mg daily, next Zocor 40 mg  daily.  (It is of note that on the flow sheet here the meds are not  listed from the visit of June 29, 2007 because they are listed in the  [place] chart.  They are listed in my note of June 29, 2007.).   REVIEW OF SYSTEMS:  He is feeling great.  He has no major complaints.  He has a slight difficulty with vein harvest site.  Dr. Roxan Hockey  removed the suture and the area is improved.  He is doing very well.  Otherwise his review of systems is negative.   PHYSICAL EXAM:  Blood pressure today is 165/76.  His pressures at home  have ranged from the 120-150 range nicely recorded by him.  His heart  rate is  61.  The patient had been on Benicar in the past.  This had been stopped  around the time of this surgery and now will be restarted.  The patient  is oriented to person, time and place.  Affect is normal.  HEENT:  Reveals no xanthelasma.  He has normal extraocular motion.  There are no carotid bruits.  There is no jugular venous distention.  Lungs are clear.  Respiratory effort is not labored.  Cardiac exam reveals S1-S2.  There are no clicks or significant murmurs.  The abdomen is soft with normal bowel sounds.  He has no significant peripheral edema.  The vein harvest site is nicely  healing.  There is a slight area of induration below it but it is not  warm or hot.  His a chest  lesion is nicely healed.   PROBLEMS:  1. Antral apical hypokinesis with ejection fraction in the 40-45%      range.  It is now time to restart an ACE or an ARB.  He tells me      that he did have a cough in the past with an Ace.  Therefore, he      will be put back on Benicar and he needs an ARB specifically for      this reason.  We will use 20 of Benicar.  In the past, he had been      on 70 of Benicar.  2. Next status post tonsillectomy, ear surgery, and rotator cuff      surgery.  3. Next history of anxiety.  In the hospital he had been on Celexa and      we stopped it in the [place] office on June 29, 2007.  He has      done well since then and he does not require it.  4. History of nephrolithiasis.  5. Next history of a lower gastrointestinal bleed in 2002 secondary to      presumed diverticular disease.  6. History of creatinine 1.6-1.7.  BMET will be rechecked today.  7. Next history of benign prostatic hypertrophy.  8. Next hyperlipidemia.  His recent labs reveal an LDL of 55 and      triglycerides 106, HDL 35 and a total cholesterol of 111.  I will      not start Niaspan at this point.  We will consider this in the      future.  He is on fish oil.  9. Next diabetes treated.  10.Next  hypertension treated.  We are restarting Benicar as mentioned.  11.Next postoperative atrial fibrillation with amiodarone.  The      amiodarone was stopped in January and he is remaining in sinus      rhythm on physical exam.  12.Next volume overload.  He carried some extra volume post      surgically.  We now have had him on a low dose of Lasix and he will      stop this at this point and will see him in follow-up.  13.Status post phlebitis in the right forearm that improved and is      gone.  14.Coronary disease post CABG x4 by Dr. Roxan Hockey recently.  15.Next history of a cough from ACE inhibitor in the past and he      continues now restarted on an ARB.  16.Next HDL of 35.  This will be kept in mind over time.  His exercise      program is increasing now.   He is doing very well overall.  I will see him back in 6 weeks.  Lasix  is stopped potassium to be stopped, Benicar added and BMET sent today.     John Bjornstad, MD, [facility]  Electronically Signed    JDK/MedQ  DD: 08/08/2007  DT: 08/08/2007  Job #: [id number]   cc:   Jerene Bears, MD

## 2010-10-20 NOTE — Op Note (Signed)
NAMEJENCARLOS, PLY NO.:  [id number]   MEDICAL RECORD NO.:  [id number]          PATIENT TYPE:  INP   LOCATION:  2312                         FACILITY:  [facility]   PHYSICIAN:  Revonda Standard. Roxan Hockey, M.D.DATE OF BIRTH:  [birth year]   DATE OF PROCEDURE:  05/30/2007  DATE OF DISCHARGE:                               OPERATIVE REPORT   PREOPERATIVE DIAGNOSIS:  Critical three-vessel coronary disease with  postinfarct unstable angina.   POSTOPERATIVE DIAGNOSIS:  Critical three-vessel coronary disease with  postinfarct unstable angina.   PROCEDURE:  Urgent median sternotomy extracorporeal circulation coronary  artery bypass grafting x4 (left internal mammary artery to LAD,  saphenous vein graft to first diagonal, saphenous vein graft to obtuse  marginal one, saphenous vein graft to distal right coronary), endoscopic  vein harvest right leg.   SURGEON:  Modesto Charon, M.D.   ASSISTANT:  John Giovanni, PA-C.   ANESTHESIA:  General.   FINDINGS:  Intramyocardial vessels to LAD and OM deeply intramyocardial,  fair-quality targets, good-quality vein and good quality mammary.   CLINICAL NOTE:  Mr. Mckinney is a 77 year old gentleman who presented with  chest pain. He ruled in for myocardial infarction.  He underwent cardiac  catheterization which revealed severe three-vessel coronary disease.  The patient had postinfarct angina during the catheterization procedure,  but did stabilize.  Given the critical nature of his coronary disease as  well as unstable pain, the patient was advised to undergo urgent  coronary artery bypass grafting.  The indications, benefits and  alternatives were discussed in detail with the patient and separately  with his family.  They were in agreement with the planned procedure.   OPERATIVE NOTE:  Mr. Kichline was brought to the preop holding area on  May 30, 2007.  The Anesthesia Service placed lines for monitoring  arterial, central  venous and pulmonary arterial pressure.  Intravenous  antibiotics were administered.  He was taken to the operating room,  anesthetized and intubated.  A Foley catheter was placed.  The chest,  abdomen and legs were prepped and draped in the usual fashion.  A median  sternotomy was performed.  The left internal mammary artery was  harvested using standard technique.  Simultaneously an incision was made  in the medial aspect the right leg at the level of the knee.  The  greater saphenous vein was identified and was harvested endoscopically.  Saphenous vein was of good quality as was the mammary artery.  Five  thousand units of heparin was administered during the vessel harvest and  the remainder of the full heparin dose was given prior to opening the  pericardium.   The pericardium was opened.  The ascending aorta was inspected.  There  was no evidence of atherosclerotic disease.  There was evidence of heavy  atherosclerotic disease in the arch and the descending aorta by  catheterization, but the ascending aorta but was not affected.  The  aorta was cannulated via concentric 2-0 Ethibond pledgeted pursestring  sutures.  A dual stage venous cannula was placed via pursestring suture  in the right atrial appendage.  Cardiopulmonary bypass was instituted  and the patient was cooled to 32 degrees Celsius.  The coronary arteries  were inspected.  They were intramyocardial and difficult to access  particularly in the case of the obtuse marginal and LAD.  The conduits  were inspected and cut to length.  A foam pad was placed in the  pericardium to insulate the heart and protect the left phrenic nerve.  A  temperature probe was placed in the myocardial septum and a cardioplegic  cannula was placed in the ascending aorta.   The aorta was cross clamped, the left ventricle was emptied via the  aortic root vent.  Cardiac arrest was then achieved with a combination  of cold antegrade blood  cardioplegia and topical iced saline.  1200 mL  of cardioplegia was administered.  The myocardial septal temperature was  10 degrees Celsius.  The following distal anastomoses were performed.   First a reverse saphenous vein graft was placed end-to-side in the  distal right coronary.  The right coronary was diffusely diseased.  The  distal right coronary did have some plaquing, but the terminal branches  were relatively small.  The vein graft was anastomosed end-to-side to  the distal right coronary with a running 7-0 Prolene suture.  There was  excellent flow through the graft.  Cardioplegia was administered.  There  was good hemostasis.   Next a reverse saphenous vein graft was placed end-to-side to the obtuse  marginal branch of the left circumflex.  This was a dominant lateral  branch.  It was 1.5 mm in diameter.  It was intramyocardial and was  grafted at most distal point that it could be safely dissected out.  The  vein was anastomosed end-to-side with a running 7-0 Prolene suture.   Next a reverse saphenous vein graft was placed end-to-side to the first  diagonal.  This was a large anterolateral branch.  It was a 1.5 mm  vessel.  Vein graft was anastomosed end-to-side with a running 7-0  Prolene suture.   Next the left internal mammary artery was brought through a window in  the pericardium.  The distal end was beveled and was anastomosed end-to-  side to the distal LAD.  The LAD was deeply intramyocardial and came to  the surface only distally at the apex and there was some plaquing at  that site and the vessel was not graftable there.  The myocardium was  dissected more proximally to a site that was suitable for grafting.  It  was a 1.5 mm vessel free of disease at site of the anastomosis, although  again it was deeply intramyocardial.  The mammary was anastomosed end-to-  side with a running 8-0 Prolene suture.  At the completion the mammary,  the LAD anastomosis, the  bulldog clamp was briefly removed and inspected  for hemostasis.  Immediate and rapid septal rewarming was noted.  The  bulldog clamp was replaced and the mammary pedicle was tacked to the  epicardial surface of the heart with 6-0 Prolene sutures.   Additional cardioplegia was administered.  The vein grafts were cut to  length.  The proximal vein graft anastomoses were performed to 4.5 mm  punch aortotomies with running 6-0 Prolene sutures.  At the completion  of the final proximal anastomosis, lidocaine was administered.  The  patient was placed in Trendelenburg position.  The bulldog clamp was  again removed from the left mammary artery.  The aortic root was de-  aired and the  aortic crossclamp was removed.  The total crossclamp time  was 75 minutes.  The patient required a single defibrillation with 20  joules and remained in sinus rhythm thereafter.   While the patient was being rewarmed, all proximal and distal  anastomoses were inspected for hemostasis.  Epicardial pacing wires were  placed on the right ventricle and right atrium.  When the patient had  rewarmed to a core temperature of 37 degrees Celsius, he was weaned from  cardiopulmonary bypass without difficulty on the first attempt.  He did  not require inotropic support.  The initial cardiac index greater than 2  liters per minute per meter squared.  The total bypass time was 102  minutes.  In the initial few minutes after coming off bypass, the  patient had a transient drop in his systemic blood pressure, but  maintained cardiac output.  This responded to resuscitative measures and  the patient was hemodynamically stable thereafter.   A test dose of protamine was administered and was well tolerated.  The  atrial and aortic cannulae were removed.  The remainder of the protamine  was administered without incident.  The chest was irrigated with 1 liter  of normal saline containing 1 gram of vancomycin.  Hemostasis was   achieved.  A left pleural and two mediastinal chest tubes were placed  through separate subcostal incisions.  The pericardium was not  reapproximated.  The sternum was closed with a combination of single and  double heavy gauge stainless steel wires.  The pectoralis fascia,  subcutaneous tissue and skin were closed in standard fashion.  All  sponge, needle and sponge counts were correct at the end of the  procedure.  The patient was taken from the operating room to the  surgical intensive care unit in fair condition.      Revonda Standard Roxan Hockey, M.D.  Electronically Signed     SCH/MEDQ  D:  05/30/2007  T:  05/31/2007  Job:  [id number]   cc:   Carlena Bjornstad, MD, [facility]  Jerene Bears, MD

## 2010-10-20 NOTE — Assessment & Plan Note (Signed)
OFFICE VISIT   CEQUAN, PEDERSON  DOB:  [birth year]                                        July 19, 2007  CHART #:  [id number]   REASON FOR VISIT:  Followup following coronary artery bypass grafting.   Patient is a 77 year old gentleman who presented with an acute MI back  in December.  He had a catheterization and had unstable post-infarct  angina.  He underwent emergent coronary artery bypass grafting on  05/30/07.  Postoperatively, he had some atrial fibrillation requiring  amiodarone.  He also had some renal insufficiency but eventually was  discharged home without any major complications.  He now returns for  followup.  He saw Dr. Ron Parker a couple of weeks ago and was making good  progress at that time, and he says he has continued to do well since  then.  He still has some mild discomfort but is not taking any narcotics  since he left the hospital.  He says he has been coughing but is afraid  to take any cough medication.   His Xanax and amiodarone and Celexa have been discontinued.  Otherwise,  he remains on aspirin, Toprol, Zocor, Uroxatral, Amaryl, Lasix, and  potassium.   PHYSICAL EXAMINATION:  Patient is a well-appearing 77 year old gentleman  in no acute distress.  His blood pressure is 148/79, pulse 69,  respirations 18.  Oxygen saturation is 95% on room air.  His lungs are  clear with equal breath sounds bilaterally.  There are no rales or  wheezing.  His cardiac exam has a regular rate and rhythm with normal S1  and S2.  There is no rub.  His sternum is stable.  Sternal incision is  intact.  There is some erythema consistent with reaction to the Vicryl  sutures, more so than infection.  There is no sign of infection.  Leg  incision:  There is some purplish discoloration and suture granuloma.  There is no calf swelling or tenderness.   LABORATORY DATA:  Chest x-ray shows good aeration of the lungs  bilaterally.  There are some pleural  plaques that have been noted  previously.  There is no significant effusion.   IMPRESSION:  Patient is a 77 year old gentleman status post urgent  coronary artery bypass grafting.  He is doing well at this point in  time.  His exercise tolerance is improving.  He is anxious to return to  full activities.  He may begin driving.  Appropriate precautions were  discussed.  He no longer has to worry about the 10 pound weight limit  but should gradually build up in his upper body use.  He may take over-  the-counter decongestants or cough medication as needed.  He will  continue to be followed by Dr. Ron Parker up in [place].  I would be happy to see  him back at any time if I could be of any further assistance for his  care.   Revonda Standard Roxan Hockey, M.D.  Electronically Signed   SCH/MEDQ  D:  07/19/2007  T:  07/20/2007  Job:  [id number]   cc:   Carlena Bjornstad, MD, [facility]  Jerene Bears, MD

## 2010-10-20 NOTE — Assessment & Plan Note (Signed)
[facility] OFFICE NOTE   NAME:John Moore, John Moore                       MRN:          [id number]  DATE:03/21/2008                            DOB:          Aug 27, 1927    John Moore is seen for the followup of his coronary artery disease and  left ventricular dysfunction and paroxysmal atrial fibrillation and  hypertension.  The patient underwent CABG in December 2008.  He has done  well.  He is not having any significant chest pain that sounds like  angina.  He has no shortness of breath.  He has no palpitations.  There  is no syncope or presyncope.  He is on a beta-blocker and an ARB  appropriately so for his left ventricular dysfunction.  He is not having  any palpitations.  He had had atrial fib at the time of his surgery  treated with amiodarone, but he has been in sinus and held sinus.  He is  on blood pressure medications.  Recently, his sodium was low and his  creatinine had increased somewhat.  Dr. Woody Seller  held his  hydrochlorothiazide.  He also increased his Toprol.  The patient's wife  has been keeping his blood pressure, and in the morning, his pressure  tends to run with a systolic in the range of [id number].  Later in the day, it  does increase somewhat.  It is, of note, that he takes his Toprol in the  morning and his Benicar in the evening.  It is possible that he may need  a more blood pressure medications, but I do not feel that this should be  changed today.  He will be following up with Dr. Woody Seller.   The patient has other new problems.  He has a problem with significant  burping and hiccups intermittently.  He also may have some reflux.  He  has taken Prilosec in the past.  He does not have any nausea or  vomiting.  Another additional problem is leg cramps at night.  He does  not have pain with walking.  These symptoms sound more like evening leg  cramps.  Additional problem is low sodium.  As mentioned, Dr.  Woody Seller has  been following this with labs.  His most recent sodium was 130.   PAST MEDICAL HISTORY:   ALLERGIES:  IV DYE.   MEDICATIONS:  1. Multivitamin.  2. Uroxatral.  3. Zocor 40.  4. Benicar 40 taken in the evening.  5. Aspirin 81.  6. Alprazolam.  7. Zyrtec p.r.n.  8. Amaryl 4 in the morning.  9. Metformin 500 b.i.d.  10.Toprol-XL 50 in the morning.   OTHER MEDICAL PROBLEMS:  See the complete list below.   REVIEW OF SYSTEMS:  He is not having any nausea or vomiting.  He has no  GU symptoms.  He is complaining of some pain around his left nipple.  This is very localized.  He has had it for several weeks.  It does not  hurt with inspiration or motion.  Otherwise, his review of systems is  negative.  PHYSICAL EXAMINATION:  Blood pressure today is elevated, it is 180/85  with a pulse of 56.  Weight is 193, which has increased.  He is  encouraged to lose weight.  He is here with his wife and his daughter.  HEENT reveals no xanthelasma.  He has normal extraocular motion.  He is  oriented to person, time, and place.  Affect is normal.  There is no  jugular venous distention.  He has no carotid bruits.  Lungs are clear.  Respiratory effort is not labored.  Cardiac exam reveals an S1 with an  S2.  There are no clicks or significant murmurs.  Examination of the  area of his left nipple reveals no obvious masses.  His abdomen is soft.  He has no peripheral edema.   No labs are done today.   PROBLEMS:  1. Coronary artery disease post coronary artery bypass grafting in      December 2008.  2. Ejection fraction 40-45% by echocardiogram.  He is on appropriate      medications.  We will consider an echo in the future but not now.  3. Postoperative atrial fibrillation, treated briefly with amiodarone      and he is now holding sinus rhythm by history.  We did not do an      EKG today.  His rhythm is regular on exam.  4. Hypertension.  He will obtain more blood pressure checks at  home      and he will be seeing Dr. Woody Seller.  It may be necessary to add another      medication.  5. Dyslipidemia treated.  6. Diabetes treated.  7. Mild chronic renal insufficiency.  His creatinine is being watched      carefully by Dr. Woody Seller and his hydrochlorothiazide was stopped.  8. Remote tobacco history.  9. Contrast dye allergy.  10.History of a lower gastrointestinal bleed in 2002.  11.History of angiotensin-converting enzyme inhibitor intolerance.  He      is on Benicar and doing well with this.  12.Recent problems with some burping and hiccups and possibly some      reflux.  I told him it was safe for him to use Prilosec.  13.Nighttime leg cramps.  I doubt that this is a significant problem.      I discussed this with him.  He does not need any vascular studies.      He will use some tonic water at night that might help.  14.Discomfort in his nipple.  Exact etiology is not clear.  He is      being followed by Dr. Woody Seller.  If this does not improve, he may need      a mammogram to be sure that he is not having any significant      problem with his left breast.   Overall, he is stable.  I will see him back in 6 months.  We discussed  fully his approach to weight loss.     Carlena Bjornstad, MD, [facility]  Electronically Signed    JDK/MedQ  DD: 03/21/2008  DT: 03/22/2008  Job #: [id number]   cc:   Jerene Bears, MD

## 2010-10-20 NOTE — Assessment & Plan Note (Signed)
[facility] OFFICE NOTE   John Moore, John Moore                       MRN:          [id number]  DATE:09/12/2007                            DOB:          January 29, 1928    PRIMARY CARDIOLOGIST:  Carlena Bjornstad, MD, [facility]   REASON FOR VISIT:  Scheduled office followup.   Please refer to Dr. Ron Parker' recent clinic note of  August 08, 2007, for full  details.   At that time, medications were adjusted with initiation of Benicar 20 mg  daily for better blood pressure control.  The patient was noted to have  had intolerance to ACE inhibitor in the past, secondary to cough.  Since  he was started on Benicar, however, the patient was noted to have  persistent hypertension, and this was thus uptitrated to 40 mg daily, by  Dr. Woody Seller.   The patient was also instructed to stop Lasix, with concomitant  potassium, given that he did not have any significant lower extremity  edema.   A baseline BMET showed normal renal function with a creatinine of 1.3.  Potassium was 4.6.   Clinically, John Moore continues to do well since undergoing multivessel  CABG in December 2008, by Dr. Roxan Hockey.  He has not had any of the  prior symptoms which were notable for significant exertional dyspnea and  anterior chest tightness.  He has been participating in cardiac rehab.  He has also expressed an interest in being more active outside of his  home, including using a riding mower to [employer] the lawn.   The patient has also been closely monitoring his weights at home.  There  has been an increase of approximately 3-4 pounds since his last office  visit.  Our data suggests an increase of 3 pounds.   Of note, the patient has also noted some recent mild epistaxis.  He is  also complaining of allergic rhinitis.   CURRENT MEDICATIONS:  1. Toprol XL 12.5 mg daily.  2. Zocor 40 mg daily.  3. Benicar 40 mg daily.  4. Glimepiride 4 mg b.i.d.  5.  Full-dose aspirin.  6. Fish oil 1000 mg daily.   PHYSICAL EXAMINATION:  VITAL SIGNS:  Blood pressure 175/81, pulse 58.  Weight 185 (up 3).  GENERAL:  A 77 year old male sitting upright in no distress.  HEENT:  Normocephalic, atraumatic.  NECK:  Palpable carotid pulse without bruits; no JVD.  LUNGS:  Clear to auscultation all fields.  HEART:  Regular rate and rhythm (S1, S2). No significant murmurs.  No  rubs.  ABDOMEN:  Soft, nontender, intact bowel sounds.  Well-healed mid sternal  incision.  EXTREMITIES:  Trace pedal edema.  NEUROLOGIC:  No focal deficit.   IMPRESSION:  1. Ischemic cardiomyopathy.      a.     Status post four-vessel coronary artery bypass grafting,       December 2008.      b.     Ejection fraction 40-45% by 2-D echocardiogram.      c.     Postoperative paroxysmal atrial fibrillation, treated with  amiodarone.  2. Uncontrolled hypertension.  3. Dyslipidemia.  4. Type 2 diabetes mellitus.  5. Remote tobacco.  6. Mild, chronic renal insufficiency.  7. CONTRAST DYE allergy.   PLAN:  1. Add hydrochlorothiazide 12.5 mg daily for better blood pressure      control.  2. Follow up BMET in 1 week for close monitoring of electrolytes and      renal function.  3. Decrease aspirin initially to 162 daily, then 81 mg indefinitely.  4. Recommend Zyrtec 5 mg daily for treatment of allergic rhinitis.      The patient has been instructed to not use any anti-allergy      medications that have a decongestant.  I, thus, suggested either      plain Claritin or Zyrtec.  5. Schedule return clinic followup with myself and Dr. Ron Parker in 2      months, for continued close monitoring and further recommendations.      Mannie Stabile, PA-C  Electronically Signed      Carlena Bjornstad, MD, [facility]  Electronically Signed   GS/MedQ  DD: 09/12/2007  DT: 09/12/2007  Job #: [id number]   cc:   Jerene Bears, MD

## 2010-10-20 NOTE — Cardiovascular Report (Signed)
NAMEADHRIT, CACCAVALE NO.:  [id number]   MEDICAL RECORD NO.:  [id number]          PATIENT TYPE:  INP   LOCATION:  2807                         FACILITY:  [facility]   PHYSICIAN:  Loretha Brasil. Lia Foyer, MD, FACCDATE OF BIRTH:  [birth year]   DATE OF PROCEDURE:  05/30/2007  DATE OF DISCHARGE:                            CARDIAC CATHETERIZATION   INDICATIONS:  Mr. Murzyn is a 77 year old gentleman who presents with a  several month history of substernal chest discomfort.  He was admitted  yesterday, and enzymes here at [facility] this morning are positive.  The patient was brought to the Catheterization Laboratory with some mild  ongoing substernal discomfort.  Risks were discussed with the patient in  the Catheterization Laboratory at the time.   PROCEDURES:  1. Left heart catheterization.  2. Selective coronary arteriography.  3. Selective left ventriculography.  4. Subclavian angiography.   DESCRIPTION OF THE PROCEDURE:  The patient was brought to the  Catheterization Laboratory and prepped and draped in the usual fashion.  Through an anterior puncture, the femoral artery was entered and a 5-  [place] sheath was placed.  Views of the left and right coronary arteries  were then obtained in multiple angiographic projections.  We used a  Glidewire to gain access to the subclavian.  Central aortic and left  ventricular pressures were measured with a pigtail.  Ventriculography  was then done in the RAO projection.  Following this, we did hand  injections into the proximal aortic root as well as the mid aorta.  All  catheters were subsequently removed.  The patient had some mild ongoing  chest discomfort.  He has severe 3-vessel disease requiring likely  revascularization surgery.  I called for a surgical consult.  Both Dr.  Roxan Hockey and Prescott Gum are apparently in the operating room.  Dr.  Roxan Hockey subsequently called me back, and they will see the patient  after his  current bypass is completed.   While in the laboratory, we gave the patient three doses of 2.5 mg of  intravenous metoprolol.  We also gave the patient 20 mg of intravenous  Lasix for an elevated left ventricular end-diastolic pressure.  Finally,  the patient was given doses of heparin, and the femoral sheath was sewn  into place and he was taken back to the holding area with a heparin drip  in place.  Also, pre-CABG Dopplers are being ordered.  Preparations were  being made for revascularization surgery.  I then spoke with the  patient's wife by phone and then subsequently with his daughter who  arrived after the procedure started.   HEMODYNAMIC DATA:  1. The central aortic pressure was 138/78, mean 105.  2. Left ventricular pressure 133/36.  3. There was no gradient on pullback across the aortic valve.   ANGIOGRAPHIC DATA:  1. On plain fluoroscopy, there is heavy calcification of the      coronaries, particularly the left main, proximal LAD and proximal      circumflex.  The RCA is also calcified.  2. The left main is diffusely calcified and tapers distally  with about      30% narrowing.  It then divides into a LAD and circumflex, both of      which have high grade ostial disease.  3. The left anterior descending artery has a 95% stenosis proximally.      This is followed by probably 60-70% mid disease.  Then there is 70%      segmental plaquing in the mid vessel; then tandem lesions of about      50-60% in the distal vessel.  The distal vessel wraps the apex.      There is a large first diagonal branch which has 90% proximal      narrowing and 80% mid-narrowing.  The distal vessel was suitable      for grafting.  The second diagonal branch has diffuse irregularity      compatible with diabetes with about a 70% sub-branch stenosis.  4. The circumflex demonstrates an ostial stenosis of about 90%.  There      is diffuse 70% narrowing after the takeoff of the AV vessel.  The       distal vessel does appear to be suitable for grafting.  5. The right coronary artery demonstrates heavy calcification.  There      is about 40-50% narrowing in the proximal bend followed by a 50-60%      area of diffuse narrowing in the midvessel.  There are diffuse      distal changes compatible with diabetes and a small caliber      PDA/PLA.  There is also a fairly large acute marginal branch which      probably would be suitable for grafting.  6. Ventriculography in the RAO projection reveals an ejection fraction      in the range of about 45% estimate.  The anterolateral apical      distal inferior wall are all hypokinetic compatible with an LAD      territory.  They do, however, move.  7. The subclavian vessel appears to be patent.  There is ostial      stenosis of about 50% involving the vertebral, and the subclavian      is calcified.  There does appear to be vigorous flow into the      mammary.   CONCLUSIONS:  1. Severe 3-vessel coronary artery disease.  2. Mild reduction in left ventricular function with an anteroapical      wall motion abnormality, but not akinesis.  3. Patent internal mammary without a gradient across this area.  4. Non-insulin dependent diabetes mellitus.   DISPOSITION:  I have spoken with Dr. Ron Parker.  I have also spoken with Dr.  Roxan Hockey.  They will see the patient as soon as they come out of the  operating room.  Until then, the femoral sheath will be held in place  and the patient will be continued on a heparin drip.      Loretha Brasil. Lia Foyer, MD, [facility]  Electronically Signed     TDS/MEDQ  D:  05/30/2007  T:  05/30/2007  Job:  [id number]   cc:   Carlena Bjornstad, MD, [facility]  Dhruv Paoli  CV Laboratory  Patient's medical record

## 2010-10-20 NOTE — Assessment & Plan Note (Signed)
[facility] OFFICE NOTE   John Moore, John Moore                       MRN:          [id number]  DATE:11/09/2007                            DOB:          12/10/1927    PRIMARY CARDIOLOGIST:  Carlena Bjornstad, MD, [facility]   REASON FOR VISIT:  Scheduled clinic followup.  Please refer to my  previous office note of April 7, for full details.   John Moore continues to do well from a cardiovascular standpoint.  He  completed his cardiac rehab session 1 week ago.  He did not report any  exertional angina pectoris.   John Moore reports improved blood pressure control, since I started him  on hydrochlorothiazide 12.5 daily.  He reports systolic readings in the  110 - 120 range in the morning; however, he does note that it trends  upward to readings of 150 - 160 later that same evening.  The patient  has gained 5 pounds.  However, he attributes this to improved appetite,  and not to volume overload.  He denies any orthopnea, PND or significant  lower extremity edema.  The patient has a history of postop PAF,  previously treated with amiodarone following his bypass surgery.  He  denies any tachy palpitations.  Followup blood work showed excellent  renal function with a creatinine of 1.1, following the addition of HCTZ.   CURRENT MEDICATIONS:  As previously noted, with the addition of HCTZ  12.5 daily, down titration of aspirin to 81 daily.   PHYSICAL EXAMINATION:  VITAL SIGNS:  Blood pressure 155/80, pulse 57,  regular weight 190 (up 5).  GENERAL:  A 77 year old male, sitting upright, in no distress.  HEENT:  Normocephalic, atraumatic.  NECK:  Palpable bilateral carotid pulse without bruits; no JVD.  LUNGS:  Clear to auscultation all fields.  HEART:  Regular rate and rhythm (S1, S2).  No significant murmurs.  ABDOMEN:  Protuberant, nontender, intact bowel sounds.  EXTREMITIES:  Trace pedal edema.  NEUROLOGICAL:  No  focal deficit.   IMPRESSION:  1. Ischemic cardiomyopathy.      a.     Four vessel CABG, December 2008.      b.     Ejection fraction 40 - 45% by 2-D echo.      c.     Postop paroxysmal atrial fibrillation, treated with       amiodarone.  2. Hypertension, improved.  3. Dyslipidemia.  4. Type 2 diabetes mellitus.  5. Mild chronic renal insufficiency, stable.  6. Remote tobacco.  7. Contrast dye allergy.  8. History of lower gastrointestinal bleed in 2002.  9. ACE inhibitor intolerance.      a.     Improved following substitution with Benicar.   PLAN:  1. Continue current medication regimen.  The patient is advised,      however, to continue to closely monitor his blood pressure at home.      We will reassess this when he returns to our clinic in the next      several months.  2. The patient is encouraged  to continue ambulating as much as      possible, now that he has completed the cardiac rehab program.  3. Continued aggressive lipid management with target LDL of 70, or      less.  His last lipid profile in February of this year showed      excellent control, with an LDL of 55.  4. Schedule return clinic followup with myself and Dr. Ron Parker in 4      months, or sooner as needed.      Mannie Stabile, PA-C  Electronically Signed      Carlena Bjornstad, MD, [facility]  Electronically Signed   GS/MedQ  DD: 11/09/2007  DT: 11/09/2007  Job #: [id number]   cc:   Jerene Bears, MD

## 2010-10-23 DIAGNOSIS — R142 Eructation: Secondary | ICD-10-CM | POA: Insufficient documentation

## 2010-10-23 DIAGNOSIS — E785 Hyperlipidemia, unspecified: Secondary | ICD-10-CM | POA: Insufficient documentation

## 2010-10-23 DIAGNOSIS — I4891 Unspecified atrial fibrillation: Secondary | ICD-10-CM | POA: Insufficient documentation

## 2010-10-23 DIAGNOSIS — T508X5A Adverse effect of diagnostic agents, initial encounter: Secondary | ICD-10-CM | POA: Insufficient documentation

## 2010-10-23 DIAGNOSIS — I10 Essential (primary) hypertension: Secondary | ICD-10-CM | POA: Insufficient documentation

## 2010-10-26 ENCOUNTER — Ambulatory Visit (INDEPENDENT_AMBULATORY_CARE_PROVIDER_SITE_OTHER): Payer: Medicare Other | Admitting: Cardiology

## 2010-10-26 ENCOUNTER — Encounter: Payer: Self-pay | Admitting: Cardiology

## 2010-10-26 DIAGNOSIS — I2510 Atherosclerotic heart disease of native coronary artery without angina pectoris: Secondary | ICD-10-CM

## 2010-10-26 DIAGNOSIS — I4891 Unspecified atrial fibrillation: Secondary | ICD-10-CM

## 2010-10-26 DIAGNOSIS — R9430 Abnormal result of cardiovascular function study, unspecified: Secondary | ICD-10-CM | POA: Insufficient documentation

## 2010-10-26 DIAGNOSIS — E079 Disorder of thyroid, unspecified: Secondary | ICD-10-CM | POA: Insufficient documentation

## 2010-10-26 DIAGNOSIS — N429 Disorder of prostate, unspecified: Secondary | ICD-10-CM

## 2010-10-26 DIAGNOSIS — I428 Other cardiomyopathies: Secondary | ICD-10-CM

## 2010-10-26 NOTE — Assessment & Plan Note (Signed)
His thyroid is being evaluated and I will send a copy of the records to his ENT doctor

## 2010-10-26 NOTE — Patient Instructions (Signed)
Continue all current medications. Follow up in  3 months - see appointment above.

## 2010-10-26 NOTE — Progress Notes (Signed)
HPI Patient is seen today for followup cardiomyopathy.  I saw him last April and 9, 2012.  Plans were then made for a followup two-dimensional echo.  The study was done October 02, 2010.  His ejection fraction has improved to 35%.  Right ventricular function is good.  Right heart pressure is 42 mm mercury which is improved.  All of this data suggests that he has improved somewhat since we've adjusted medicines for his cardiomyopathy.  He is feeling well.  He will be seeing ENT for thyroid question.  He is following with his primary doctor concerning hyponatremia. Allergies  Allergen Reactions  . Iodinated Diagnostic Agents Rash    Current Outpatient Prescriptions  Medication Sig Dispense Refill  . alfuzosin (UROXATRAL) 10 MG 24 hr tablet Take 10 mg by mouth daily.        Marland Kitchen amLODipine (NORVASC) 10 MG tablet Take 10 mg by mouth daily.        Marland Kitchen aspirin 81 MG tablet Take 81 mg by mouth daily.        . carvedilol (COREG) 25 MG tablet Take 1 tablet (25 mg total) by mouth 2 (two) times daily with a meal.  180 tablet  3  . furosemide (LASIX) 20 MG tablet Take 20 mg by mouth daily.        . metFORMIN (GLUCOPHAGE) 500 MG tablet Take 1,000 mg by mouth 2 (two) times daily with a meal.       . nitroGLYCERIN (NITROSTAT) 0.4 MG SL tablet Place 0.4 mg under the tongue every 5 (five) minutes as needed.        Marland Kitchen olmesartan (BENICAR) 40 MG tablet Take 40 mg by mouth daily.        . simvastatin (ZOCOR) 20 MG tablet Take 20 mg by mouth at bedtime.        Marland Kitchen DISCONTD: furosemide (LASIX) 40 MG tablet Take 40 mg by mouth daily.        Marland Kitchen DISCONTD: glimepiride (AMARYL) 4 MG tablet Take 4 mg by mouth 2 (two) times daily.        Marland Kitchen DISCONTD: Multiple Vitamins-Minerals (MULTIVITAMIN WITH MINERALS) tablet Take 1 tablet by mouth daily.          History   Social History  . Marital Status: Married    Spouse Name: N/A    Number of Children: N/A  . Years of Education: N/A   Occupational History  . Not on file.   Social  History Main Topics  . Smoking status: Former Smoker    Quit date: 06/07/1978  . Smokeless tobacco: Never Used  . Alcohol Use: No  . Drug Use: No  . Sexually Active: Not on file   Other Topics Concern  . Not on file   Social History Narrative  . No narrative on file    No family history on file.  Past Medical History  Diagnosis Date  . Nipple pain     resolved october 2010  . Leg cramps     night time  . Burping     with hiccups ...question reflux... improved 03/2009  . ACE inhibitor intolerance     ...tolerates ARB  . GI bleeding     ...lower...2002  . Allergic reaction to contrast dye   . Renal insufficiency april 15,2010    ...mild...creatine 1.5.Marland Kitchen  Marland Kitchen Diabetes mellitus   . Dyslipidemia   . Hypertension   . Atrial fibrillation     atrial fibrillation ..postopCABG....brief treatmentamiodarone...stopped.Marland Kitchen.NSR  . Ejection  fraction < 50%     EF 40-45%, echo, December, 2008 / EF 20-25%, echo, August, 2011 /  EF 35-40%, echo, April, 2012  . Mitral regurgitation     ...mild..echo...2008...mild/moderate by echo 01/2010  . CAD (coronary artery disease)     EF 40-45%, December, 2008  /  ejection fraction 20-25%, echo, August, 2011  . CHF (congestive heart failure)     Chronic systolic  . Cardiomyopathy     EF was noted to be worse August, 2011.  No ischemic workup done at that time.  Decision made to treat LV dysfunction and followup and make other decisions later  . Shoulder injury     Patient fell (not syncope) April, 2012    Past Surgical History  Procedure Date  . Coronary artery bypass graft     december 2008    ROS  Patient denies fever, chills, headache, sweats, rash, change in vision, change in hearing, chest pain, cough, nausea vomiting, urinary symptoms.  All other systems are reviewed and are negative.  PHYSICAL EXAM Patient is oriented to person time and place.  Affect is normal.  He is here with a family member.  Head is atraumatic.  There is no  carotid bruit.  There is no jugular venous distention.  Lungs are clear.  Respiratory effort is nonlabored.  Cardiac exam reveals S1 and S2.  There is a soft systolic murmur.  The abdomen is soft.  There is no peripheral edema.  There is no musculoskeletal deformities.  There is no skin rashes. Filed Vitals:   10/26/10 1425  BP: 149/76  Pulse: 60  Height: 5\' 6"  (1.676 m)  Weight: 173 lb (78.472 kg)    EKG  EKG is done today and reviewed by me.  There is an interventricular conduction delay.  There is sinus rhythm.  There are no acute changes.  ASSESSMENT & PLAN

## 2010-10-26 NOTE — Assessment & Plan Note (Signed)
The patient's ejection fraction has improved up to 35-40%.  I'm pleased with the result.  We will continue the medications that he is currently on.  No further workup at this point.

## 2010-10-26 NOTE — Assessment & Plan Note (Signed)
Coronary disease is stable. No further workup at this time. 

## 2010-10-26 NOTE — Assessment & Plan Note (Signed)
Rhythm is stable at this time.  No further workup.

## 2010-10-26 NOTE — Assessment & Plan Note (Signed)
His prostate is being evaluated and I will send a copy to his urologist

## 2011-01-05 ENCOUNTER — Encounter: Payer: Self-pay | Admitting: Cardiology

## 2011-01-11 ENCOUNTER — Ambulatory Visit (INDEPENDENT_AMBULATORY_CARE_PROVIDER_SITE_OTHER): Payer: Medicare Other | Admitting: Cardiology

## 2011-01-11 ENCOUNTER — Encounter: Payer: Self-pay | Admitting: Cardiology

## 2011-01-11 VITALS — BP 134/74 | HR 60 | Ht 66.0 in | Wt 176.0 lb

## 2011-01-11 DIAGNOSIS — I4891 Unspecified atrial fibrillation: Secondary | ICD-10-CM

## 2011-01-11 DIAGNOSIS — I428 Other cardiomyopathies: Secondary | ICD-10-CM

## 2011-01-11 DIAGNOSIS — E079 Disorder of thyroid, unspecified: Secondary | ICD-10-CM

## 2011-01-11 DIAGNOSIS — I2510 Atherosclerotic heart disease of native coronary artery without angina pectoris: Secondary | ICD-10-CM

## 2011-01-11 MED ORDER — FUROSEMIDE 20 MG PO TABS: 20.0000 mg | ORAL_TABLET | Freq: Every day | ORAL | Status: DC

## 2011-01-11 NOTE — Progress Notes (Signed)
HPI Patient is seen for followup of cardiomyopathy.  He is doing very well.  I saw him in May, 2012 he was doing well.  He had a two-dimensional echo in April, 2012.  His ejection fraction was up to 35 or possibly even 40%.  He is tolerating his medicines.  He has no signs of heart failure.  Is not having any chest pain.  He is doing well.  He is having some mild swallowing difficulties and he will check with his primary physician. Allergies  Allergen Reactions  . Iodinated Diagnostic Agents Rash    Current Outpatient Prescriptions  Medication Sig Dispense Refill  . alfuzosin (UROXATRAL) 10 MG 24 hr tablet Take 10 mg by mouth daily.        Marland Kitchen amLODipine (NORVASC) 10 MG tablet Take 10 mg by mouth daily.        Marland Kitchen aspirin 81 MG tablet Take 81 mg by mouth daily.        . carvedilol (COREG) 25 MG tablet Take 1 tablet (25 mg total) by mouth 2 (two) times daily with a meal.  180 tablet  3  . furosemide (LASIX) 20 MG tablet Take 20 mg by mouth daily.        . metFORMIN (GLUCOPHAGE) 500 MG tablet Take 1,000 mg by mouth 2 (two) times daily with a meal.       . nitroGLYCERIN (NITROSTAT) 0.4 MG SL tablet Place 0.4 mg under the tongue every 5 (five) minutes as needed.        Marland Kitchen olmesartan (BENICAR) 40 MG tablet Take 40 mg by mouth daily.        . simvastatin (ZOCOR) 20 MG tablet Take 20 mg by mouth at bedtime.          History   Social History  . Marital Status: Married    Spouse Name: N/A    Number of Children: N/A  . Years of Education: N/A   Occupational History  . Not on file.   Social History Main Topics  . Smoking status: Former Smoker -- 0.3 packs/day for 30 years    Types: Cigarettes    Quit date: 06/07/1978  . Smokeless tobacco: Never Used  . Alcohol Use: No  . Drug Use: No  . Sexually Active: Not on file   Other Topics Concern  . Not on file   Social History Narrative  . No narrative on file    No family history on file.  Past Medical History  Diagnosis Date  . Nipple  pain     resolved october 2010  . Leg cramps     night time  . Burping     with hiccups ...question reflux... improved 03/2009  . ACE inhibitor intolerance     ...tolerates ARB  . GI bleeding     ...lower...2002  . Allergic reaction to contrast dye   . Renal insufficiency april 15,2010    ...mild...creatine 1.5.Marland Kitchen  Marland Kitchen Diabetes mellitus   . Dyslipidemia   . Hypertension   . Atrial fibrillation     atrial fibrillation ..postopCABG....brief treatmentamiodarone...stopped.Marland Kitchen.NSR  . Ejection fraction < 50%     EF 40-45%, echo, December, 2008 / EF 20-25%, echo, August, 2011 /  EF 35-40%, echo, April, 2012  . Mitral regurgitation     ...mild..echo...2008...mild/moderate by echo 01/2010  . CAD (coronary artery disease)     EF 40-45%, December, 2008  /  ejection fraction 20-25%, echo, August, 2011  . CHF (congestive heart failure)  Chronic systolic  . Cardiomyopathy     EF was noted to be worse August, 2011.  No ischemic workup done at that time.  Decision made to treat LV dysfunction and followup and make other decisions later  . Shoulder injury     Patient fell (not syncope) April, 2012  . Prostate disorder   . Thyroid condition     Past Surgical History  Procedure Date  . Coronary artery bypass graft     december 2008    ROS  Patient denies fever, chills, headache, sweats, rash, change in vision, change in hearing, chest pain, cough, urinary symptoms.  All the systems are reviewed and are negative other than the history of present illness.  PHYSICAL EXAM Patient is oriented to person time and place.  Affect is normal.  He is here with family member.  Head is atraumatic.  Lungs are clear.  Respiratory effort is unlabored.  Cardiac exam reveals an S1-S2.  No clicks or significant murmurs.  The abdomen is soft.  There is no peripheral edema. Filed Vitals:   01/11/11 0818  BP: 134/74  Pulse: 60  Height: 5\' 6"  (1.676 m)  Weight: 176 lb (79.833 kg)    EKG Is not done  today.  ASSESSMENT & PLAN

## 2011-01-11 NOTE — Assessment & Plan Note (Signed)
Coronary disease is stable. No change in therapy. 

## 2011-01-11 NOTE — Assessment & Plan Note (Signed)
His thyroid status is being followed very carefully by his ENT doctor.  No further workup.

## 2011-01-11 NOTE — Assessment & Plan Note (Signed)
He is doing very well.  His EF had improved to 35-40%.  He has no clinical symptoms.  He is on appropriate medicines.  No further workup.

## 2011-01-11 NOTE — Patient Instructions (Signed)
Continue all current medications. Your physician wants you to follow up in: 6 months.  You will receive a reminder letter in the mail one-two months in advance.  If you don't receive a letter, please call our office to schedule the follow up appointment   

## 2011-01-27 ENCOUNTER — Ambulatory Visit: Payer: Medicare Other | Admitting: Cardiology

## 2011-02-03 ENCOUNTER — Encounter (INDEPENDENT_AMBULATORY_CARE_PROVIDER_SITE_OTHER): Payer: Self-pay | Admitting: *Deleted

## 2011-02-03 ENCOUNTER — Encounter (INDEPENDENT_AMBULATORY_CARE_PROVIDER_SITE_OTHER): Payer: Self-pay

## 2011-02-23 ENCOUNTER — Ambulatory Visit (INDEPENDENT_AMBULATORY_CARE_PROVIDER_SITE_OTHER): Payer: Medicare Other | Admitting: Internal Medicine

## 2011-02-23 ENCOUNTER — Telehealth (INDEPENDENT_AMBULATORY_CARE_PROVIDER_SITE_OTHER): Payer: Self-pay | Admitting: *Deleted

## 2011-02-23 ENCOUNTER — Encounter (INDEPENDENT_AMBULATORY_CARE_PROVIDER_SITE_OTHER): Payer: Self-pay | Admitting: Internal Medicine

## 2011-02-23 VITALS — BP 132/50 | HR 76 | Ht 65.5 in | Wt 180.0 lb

## 2011-02-23 DIAGNOSIS — R1314 Dysphagia, pharyngoesophageal phase: Secondary | ICD-10-CM

## 2011-02-23 DIAGNOSIS — R1310 Dysphagia, unspecified: Secondary | ICD-10-CM

## 2011-02-23 NOTE — Telephone Encounter (Signed)
EGD/ED sch'd 04/01/11 @ 3:15 (2:15), asa 2 days

## 2011-02-23 NOTE — Progress Notes (Signed)
Subjective:     Patient ID: John Moore, male   DOB: [birth year], 77 y.o.   MRN: [id number]  HPI  John Moore is a 77 yr old male here today with c/o dysphagia. He also tells me he has hiccups. He says foods are lodging.  His daughter says he will vomit the bolus back up.  At times he has to stop eating and wait for the bolus to go down. Symptoms x 3-4 yrs but has progressively worsened.  This is occuring about twice a week.  Meats such as stew beef will lodge.  Soft foods do not lodge.  He has no problems with pills. Appetite is good, though he not hungry. No weight loss. BM x 1 every couple of days. No melena or bright red rectal bleeding. Acid reflux according to what he eats after he lies down.  Heart rate irregular with hx of atrial fib. 10/26/10 Glucose 284, BUn 21, Creatinine 1.24, Sodium 132, K 5.1, Chloride 98, Total protein 9.8, Albumin 4.0, ALP 69, AT 40, ALT 26,  Review of Systems  See hpi  Current Outpatient Prescriptions  Medication Sig Dispense Refill  . alfuzosin (UROXATRAL) 10 MG 24 hr tablet Take 10 mg by mouth daily.        Marland Kitchen amLODipine (NORVASC) 10 MG tablet Take 10 mg by mouth daily.        Marland Kitchen aspirin 81 MG tablet Take 81 mg by mouth daily.        . carvedilol (COREG) 25 MG tablet Take 1 tablet (25 mg total) by mouth 2 (two) times daily with a meal.  180 tablet  3  . furosemide (LASIX) 20 MG tablet Take 1 tablet (20 mg total) by mouth daily.  90 tablet  3  . metFORMIN (GLUCOPHAGE) 500 MG tablet Take 1,000 mg by mouth 2 (two) times daily with a meal.       . nitroGLYCERIN (NITROSTAT) 0.4 MG SL tablet Place 0.4 mg under the tongue every 5 (five) minutes as needed.        Marland Kitchen olmesartan (BENICAR) 40 MG tablet Take 40 mg by mouth daily.        Marland Kitchen omeprazole (PRILOSEC) 40 MG capsule Take 40 mg by mouth daily.        . simvastatin (ZOCOR) 20 MG tablet Take 20 mg by mouth at bedtime.         Past Surgical History  Procedure Date  . Coronary artery bypass graft     december 2008  .  Tonsillectomy   . Rotator cuff repair   . Myrinfoplasty   . Colonoscopy 04/2001   Past Medical History  Diagnosis Date  . Nipple pain     resolved october 2010  . Leg cramps     night time  . Burping     with hiccups ...question reflux... improved 03/2009  . ACE inhibitor intolerance     ...tolerates ARB  . GI bleeding     ...lower...2002  . Allergic reaction to contrast dye   . Renal insufficiency april 15,2010    ...mild...creatine 1.5.Marland Kitchen  Marland Kitchen Diabetes mellitus     for about 20 yrs.  . Dyslipidemia   . Hypertension   . Atrial fibrillation     atrial fibrillation ..postopCABG....brief treatmentamiodarone...stopped.Marland Kitchen.NSR  . Ejection fraction < 50%     EF 40-45%, echo, December, 2008 / EF 20-25%, echo, August, 2011 /  EF 35-40%, echo, April, 2012  . Mitral regurgitation     ...mild..echo...2008...mild/moderate by  echo 01/2010  . CAD (coronary artery disease)     EF 40-45%, December, 2008  /  ejection fraction 20-25%, echo, August, 2011  . CHF (congestive heart failure)     Chronic systolic  . Cardiomyopathy     EF was noted to be worse August, 2011.  No ischemic workup done at that time.  Decision made to treat LV dysfunction and followup and make other decisions later  . Shoulder injury     Patient fell (not syncope) April, 2012  . Prostate disorder   . Thyroid condition   . GERD (gastroesophageal reflux disease)   . Dysphagia   . Type II or unspecified type diabetes mellitus without mention of complication, not stated as uncontrolled   . Diverticulitis    History   Social History  . Marital Status: Married    Spouse Name: N/A    Number of Children: N/A  . Years of Education: N/A   Occupational History  . Not on file.   Social History Main Topics  . Smoking status: Former Smoker -- 0.3 packs/day for 30 years    Types: Cigarettes    Quit date: 06/07/1978  . Smokeless tobacco: Never Used   Comment: 40 yrs ago  . Alcohol Use: No  . Drug Use: No  . Sexually  Active: Not on file   Other Topics Concern  . Not on file   Social History Narrative  . No narrative on file   Family Status  Relation Status Death Age  . Mother Deceased     MI  . Father Deceased     Ca of the throat  . Brother Deceased     etoh abuse and drugs, MI  . Child Alive     good health   Allergies  Allergen Reactions  . Ace Inhibitors   . Nsaids   . Iodinated Diagnostic Agents Rash          Objective:    Physical Exam   Filed Vitals:   02/23/11 1547  BP: 132/50  Pulse: 76   Filed Vitals:   02/23/11 1547  Height: 5' 5.5" (1.664 m)  Weight: 180 lb (81.647 kg)    Alert and oriented. Skin warm and dry. Oral mucosa is moist. Natural teeth in good condition. Sclera anicteric, conjunctivae is pink. Thyroid not enlarged. No cervical lymphadenopathy. Lungs clear. Heart irregular rate.  Abdomen is soft. Bowel sounds are positive. No hepatomegaly. No abdominal masses felt. No tenderness.  No edema to lower extremities. Patient is alert and oriented.      Assessment:    Esophageal dysphagia to solids x 3-4 yrs.  Esophageal web, or ring needs to be ruled out.    Plan:     EGD/ED    The risks and benefits such as perforation, bleeding, and infection were reviewed with the patient and is agreeable. Continue the omeprazole 40mg  daily

## 2011-02-25 LAB — BASIC METABOLIC PANEL
BUN: 32 — ABNORMAL HIGH
CO2: 24
Calcium: 8.6
Calcium: 8.7
Chloride: 97
GFR calc Af Amer: 44 — ABNORMAL LOW
GFR calc non Af Amer: 39 — ABNORMAL LOW
Glucose, Bld: 72
Sodium: 130 — ABNORMAL LOW

## 2011-03-12 LAB — BASIC METABOLIC PANEL
BUN: 20
BUN: 24 — ABNORMAL HIGH
BUN: 27 — ABNORMAL HIGH
BUN: 29 — ABNORMAL HIGH
BUN: 31 — ABNORMAL HIGH
CO2: 27
CO2: 27
Calcium: 8 — ABNORMAL LOW
Calcium: 8.1 — ABNORMAL LOW
Calcium: 8.2 — ABNORMAL LOW
Calcium: 8.5
Calcium: 8.6
Calcium: 8.8
Calcium: 9.1
Chloride: 100
Chloride: 102
Chloride: 102
Chloride: 98
Chloride: 99
Creatinine, Ser: 1.45
Creatinine, Ser: 1.57 — ABNORMAL HIGH
Creatinine, Ser: 1.62 — ABNORMAL HIGH
Creatinine, Ser: 1.67 — ABNORMAL HIGH
Creatinine, Ser: 1.72 — ABNORMAL HIGH
Creatinine, Ser: 1.78 — ABNORMAL HIGH
GFR calc Af Amer: 47 — ABNORMAL LOW
GFR calc Af Amer: 49 — ABNORMAL LOW
GFR calc Af Amer: 50 — ABNORMAL LOW
GFR calc Af Amer: 52 — ABNORMAL LOW
GFR calc Af Amer: 57 — ABNORMAL LOW
GFR calc Af Amer: >60
GFR calc non Af Amer: 36 — ABNORMAL LOW
GFR calc non Af Amer: 37 — ABNORMAL LOW
GFR calc non Af Amer: 39 — ABNORMAL LOW
GFR calc non Af Amer: 43 — ABNORMAL LOW
GFR calc non Af Amer: 52 — ABNORMAL LOW
Glucose, Bld: 116 — ABNORMAL HIGH
Glucose, Bld: 58 — ABNORMAL LOW
Glucose, Bld: 87
Potassium: 3.7
Potassium: 4
Potassium: 4.2
Sodium: 129 — ABNORMAL LOW
Sodium: 131 — ABNORMAL LOW
Sodium: 131 — ABNORMAL LOW
Sodium: 133 — ABNORMAL LOW
Sodium: 137

## 2011-03-12 LAB — POCT I-STAT 3, ART BLOOD GAS (G3+)
Acid-base deficit: 3 — ABNORMAL HIGH
Bicarbonate: 20.2
Bicarbonate: 21.8
O2 Saturation: 92
Operator id: 277551
Operator id: 3342
Patient temperature: 35.5
Patient temperature: 38.1
TCO2: 20
TCO2: 21
TCO2: 21
pCO2 arterial: 32.4 — ABNORMAL LOW
pCO2 arterial: 40.8
pCO2 arterial: 41.3
pCO2 arterial: 43.3
pH, Arterial: 7.294 — ABNORMAL LOW
pH, Arterial: 7.304 — ABNORMAL LOW
pH, Arterial: 7.435
pH, Arterial: 7.442
pO2, Arterial: 103 — ABNORMAL HIGH
pO2, Arterial: 267 — ABNORMAL HIGH
pO2, Arterial: 90

## 2011-03-12 LAB — BLOOD GAS, ARTERIAL
Acid-base deficit: 7.1 — ABNORMAL HIGH
Bicarbonate: 16.5 — ABNORMAL LOW
O2 Saturation: 97
TCO2: 17.3
pO2, Arterial: 90.3

## 2011-03-12 LAB — CARDIAC PANEL(CRET KIN+CKTOT+MB+TROPI)
CK, MB: 18 — ABNORMAL HIGH
CK, MB: 22.3 — ABNORMAL HIGH
CK, MB: 47.3 — ABNORMAL HIGH
Relative Index: 13.7 — ABNORMAL HIGH
Relative Index: 14.3 — ABNORMAL HIGH
Relative Index: INVALID
Total CK: 131
Total CK: 173
Total CK: 331 — ABNORMAL HIGH
Total CK: 98
Troponin I: 2.15
Troponin I: 2.18
Troponin I: 2.73

## 2011-03-12 LAB — CBC
HCT: 29.4 — ABNORMAL LOW
HCT: 31.7 — ABNORMAL LOW
HCT: 32.1 — ABNORMAL LOW
Hemoglobin: 10 — ABNORMAL LOW
Hemoglobin: 10.1 — ABNORMAL LOW
Hemoglobin: 11 — ABNORMAL LOW
Hemoglobin: 11 — ABNORMAL LOW
Hemoglobin: 11.1 — ABNORMAL LOW
Hemoglobin: 15.1
MCHC: 34.1
MCHC: 34.2
MCHC: 34.2
MCHC: 34.3
MCHC: 34.3
MCHC: 34.8
MCHC: 35.3
MCHC: 35.3
MCV: 97
MCV: 98.1
MCV: 98.2
MCV: 99.1
Platelets: 150
Platelets: 159
Platelets: 247
Platelets: 256
Platelets: 288
RBC: 2.97 — ABNORMAL LOW
RBC: 3.13 — ABNORMAL LOW
RBC: 3.23 — ABNORMAL LOW
RBC: 3.28 — ABNORMAL LOW
RBC: 3.29 — ABNORMAL LOW
RBC: 4.42
RBC: 4.43
RBC: 4.49
RDW: 13.3
RDW: 13.6
RDW: 13.7
RDW: 13.9
RDW: 13.9
RDW: 13.9
WBC: 10.1
WBC: 10.3
WBC: 18.8 — ABNORMAL HIGH

## 2011-03-12 LAB — COMPREHENSIVE METABOLIC PANEL
ALT: 26
ALT: 32
AST: 32
AST: 39 — ABNORMAL HIGH
Albumin: 3.4 — ABNORMAL LOW
CO2: 17 — ABNORMAL LOW
CO2: 25
Calcium: 8.8
Calcium: 9.7
GFR calc Af Amer: 58 — ABNORMAL LOW
GFR calc Af Amer: >60
GFR calc non Af Amer: 52 — ABNORMAL LOW
Sodium: 130 — ABNORMAL LOW
Sodium: 134 — ABNORMAL LOW
Total Protein: 6
Total Protein: 6.5

## 2011-03-12 LAB — PROTIME-INR
INR: 1.6 — ABNORMAL HIGH
Prothrombin Time: 19.2 — ABNORMAL HIGH

## 2011-03-12 LAB — POCT I-STAT 4, (NA,K, GLUC, HGB,HCT)
Glucose, Bld: 258 — ABNORMAL HIGH
HCT: 26 — ABNORMAL LOW
HCT: 37 — ABNORMAL LOW
Hemoglobin: 12.6 — ABNORMAL LOW
Hemoglobin: 8.8 — ABNORMAL LOW
Hemoglobin: 9.2 — ABNORMAL LOW
Operator id: 3342
Operator id: 3342
Operator id: 3342
Potassium: 4.8
Sodium: 131 — ABNORMAL LOW
Sodium: 135
Sodium: 136

## 2011-03-12 LAB — POCT I-STAT GLUCOSE: Glucose, Bld: 276 — ABNORMAL HIGH

## 2011-03-12 LAB — PLATELET COUNT: Platelets: 192

## 2011-03-12 LAB — I-STAT EC8
Acid-base deficit: 2
BUN: 22
BUN: 23
Bicarbonate: 20.5
Chloride: 102
Glucose, Bld: 178 — ABNORMAL HIGH
HCT: 32 — ABNORMAL LOW
HCT: 32 — ABNORMAL LOW
Hemoglobin: 10.9 — ABNORMAL LOW
Operator id: 279171
Operator id: 297351
Potassium: 4.1
Sodium: 135
pCO2 arterial: 32.3 — ABNORMAL LOW
pH, Arterial: 7.411

## 2011-03-12 LAB — HEPARIN LEVEL (UNFRACTIONATED): Heparin Unfractionated: 0.44

## 2011-03-12 LAB — LIPID PANEL: VLDL: 11

## 2011-03-12 LAB — CK TOTAL AND CKMB (NOT AT ARMC)
CK, MB: 179.5 — ABNORMAL HIGH
Relative Index: 16.7 — ABNORMAL HIGH

## 2011-03-12 LAB — TSH: TSH: 2.202

## 2011-03-12 LAB — TYPE AND SCREEN: ABO/RH(D): O POS

## 2011-03-12 LAB — HEMOGLOBIN AND HEMATOCRIT, BLOOD
HCT: 26.3 — ABNORMAL LOW
Hemoglobin: 8.8 — ABNORMAL LOW

## 2011-03-12 LAB — APTT: aPTT: 40 — ABNORMAL HIGH

## 2011-03-12 LAB — B-NATRIURETIC PEPTIDE (CONVERTED LAB)
Pro B Natriuretic peptide (BNP): 2106 — ABNORMAL HIGH
Pro B Natriuretic peptide (BNP): 2154 — ABNORMAL HIGH

## 2011-03-25 ENCOUNTER — Other Ambulatory Visit (INDEPENDENT_AMBULATORY_CARE_PROVIDER_SITE_OTHER): Payer: Self-pay | Admitting: *Deleted

## 2011-03-31 MED ORDER — SODIUM CHLORIDE 0.45 % IV SOLN
Freq: Once | INTRAVENOUS | Status: AC
  Administered 2011-04-01: 14:00:00 via INTRAVENOUS

## 2011-04-01 ENCOUNTER — Encounter (HOSPITAL_COMMUNITY): Payer: Self-pay

## 2011-04-01 ENCOUNTER — Encounter (HOSPITAL_COMMUNITY)
Admission: RE | Disposition: A | Discharge status: Home / Self Care | Payer: Self-pay | Source: Ambulatory Visit | Attending: Internal Medicine

## 2011-04-01 ENCOUNTER — Ambulatory Visit (HOSPITAL_COMMUNITY)
Admission: RE | Admit: 2011-04-01 | Discharge: 2011-04-01 | Disposition: A | Discharge status: Home / Self Care | Payer: Medicare Other | Source: Ambulatory Visit | Attending: Internal Medicine | Admitting: Internal Medicine

## 2011-04-01 DIAGNOSIS — I10 Essential (primary) hypertension: Secondary | ICD-10-CM | POA: Insufficient documentation

## 2011-04-01 DIAGNOSIS — K222 Esophageal obstruction: Secondary | ICD-10-CM | POA: Insufficient documentation

## 2011-04-01 DIAGNOSIS — R1310 Dysphagia, unspecified: Secondary | ICD-10-CM | POA: Insufficient documentation

## 2011-04-01 DIAGNOSIS — K2100 Gastro-esophageal reflux disease with esophagitis, without bleeding: Secondary | ICD-10-CM | POA: Insufficient documentation

## 2011-04-01 DIAGNOSIS — K449 Diaphragmatic hernia without obstruction or gangrene: Secondary | ICD-10-CM

## 2011-04-01 DIAGNOSIS — K208 Other esophagitis: Secondary | ICD-10-CM

## 2011-04-01 DIAGNOSIS — E119 Type 2 diabetes mellitus without complications: Secondary | ICD-10-CM | POA: Insufficient documentation

## 2011-04-01 DIAGNOSIS — Z79899 Other long term (current) drug therapy: Secondary | ICD-10-CM | POA: Insufficient documentation

## 2011-04-01 SURGERY — ESOPHAGOGASTRODUODENOSCOPY (EGD) WITH ESOPHAGEAL DILATION
Anesthesia: Moderate Sedation

## 2011-04-01 MED ORDER — BUTAMBEN-TETRACAINE-BENZOCAINE 2-2-14 % EX AERO
INHALATION_SPRAY | CUTANEOUS | Status: DC | PRN
  Administered 2011-04-01: 1 via TOPICAL

## 2011-04-01 MED ORDER — MIDAZOLAM HCL 5 MG/5ML IJ SOLN
INTRAMUSCULAR | Status: AC
  Filled 2011-04-01: qty 10

## 2011-04-01 MED ORDER — MEPERIDINE HCL 25 MG/ML IJ SOLN
INTRAMUSCULAR | Status: DC | PRN
  Administered 2011-04-01: 25 mg via INTRAVENOUS

## 2011-04-01 MED ORDER — MIDAZOLAM HCL 5 MG/5ML IJ SOLN
INTRAMUSCULAR | Status: DC | PRN
  Administered 2011-04-01: 2 mg via INTRAVENOUS
  Administered 2011-04-01: 1 mg via INTRAVENOUS
  Administered 2011-04-01: 2 mg via INTRAVENOUS

## 2011-04-01 MED ORDER — MEPERIDINE HCL 50 MG/ML IJ SOLN
INTRAMUSCULAR | Status: AC
  Filled 2011-04-01: qty 1

## 2011-04-01 MED ORDER — PANTOPRAZOLE SODIUM 40 MG PO TBEC: 40.0000 mg | DELAYED_RELEASE_TABLET | Freq: Every day | ORAL | Status: DC

## 2011-04-01 MED ORDER — STERILE WATER FOR IRRIGATION IR SOLN
Status: DC | PRN
  Administered 2011-04-01: 15:00:00

## 2011-04-01 NOTE — Op Note (Addendum)
EGD PROCEDURE REPORT  PATIENT:  John Moore  MR#:  [id number] Birthdate:  1928/04/28, 77 y.o., male Endoscopist:  Dr. Rogene Houston, MD Referred By:  Dr. Dahlia Client, MD. Procedure Date: 04/01/2011  Procedure:   EGD with ED  Indications:  Patient is a 77 year-old Caucasian male who presents with several month history of dysphagia to solids. He denies heartburn or weight loss. He is on PPI.            Informed Consent: Procedure and risks were reviewed with the patient and informed consent was obtained. Medications:  Demerol 25 mg IV Versed 5 mg IV Cetacaine spray topically for oropharyngeal anesthesia  Description of procedure:  The endoscope was introduced through the mouth and advanced to the second portion of the duodenum without difficulty or limitations. The mucosal surfaces were surveyed very carefully during advancement of the scope and upon withdrawal.  Findings:  Esophagus:  Mucosa of the proximal and middle third was normal. There were 2 small ulcers involving the distal 2 cm of the esophagus another small ulcer at GE junction with high grade stricture estimated to be about 7-8 mm. This stricture had to be dilated before the scope could be passed any further. GEJ:  36 cm Hiatus:  39 cm Stomach:  Stomach was empty and distended very well with insufflation.. In the proximal stomach were normal. Examination of mucosa at body, antrum, pyloric channel as well as angularis fundus and cardia was normal Duodenum:  Normal bulbar and post bulbar mucosa  Therapeutic/Diagnostic Maneuvers Performed:  Esophageal stricture was dilated with balloon dilator. Balloon dilator was positioned across the stricture under direct vision. It was initially insufflated to a diameter of 15 mm and subsequently dilated to 16.5 mm. Post dilation scope was passed across the stricture without any resistance.  Complications:  None  Impression: Ulcerated reflux esophagitis with high grade stricture at  gastroesophageal junction. This stricture was dilated with a balloon to 16.5 mm. 3 cm size sliding hiatal.  Recommendations:  Anti-reflux measures. Discontinue omeprazole Pantoprazole 40 mg by  mouth 30 minutes before breakfast and evening meal Office visit in 8 weeks  REHMAN,NAJEEB U  04/01/2011  3:42 PM  CC: Dr. Glenda Chroman., MD, MD & Dr. Rayne Du ref. provider found

## 2011-04-01 NOTE — H&P (Signed)
John Moore is an 77 y.o. male.   Chief Complaint: Patient is here for EGD and ED. HPI: Patient is a 60-year-old Caucasian male who presents with several month history of intermittent dysphagia mannerly to solids. Lately is nothing more often. When this occurs he develops hiccups. He generally is able to  swallow the food bolus or regurgitate to get relief. He denies frequent heartburn nausea vomiting abdominal pain anorexia weight loss or melena.  Past Medical History  Diagnosis Date  . Nipple pain     resolved october 2010  . Leg cramps     night time  . Burping     with hiccups ...question reflux... improved 03/2009  . ACE inhibitor intolerance     ...tolerates ARB  . GI bleeding     ...lower...2002  . Allergic reaction to contrast dye   . Renal insufficiency april 15,2010    ...mild...creatine 1.5.Marland Kitchen  Marland Kitchen Diabetes mellitus     for about 20 yrs.  . Dyslipidemia   . Hypertension   . Atrial fibrillation     atrial fibrillation ..postopCABG....brief treatmentamiodarone...stopped.Marland Kitchen.NSR  . Ejection fraction < 50%     EF 40-45%, echo, December, 2008 / EF 20-25%, echo, August, 2011 /  EF 35-40%, echo, April, 2012  . Mitral regurgitation     ...mild..echo...2008...mild/moderate by echo 01/2010  . CAD (coronary artery disease)     EF 40-45%, December, 2008  /  ejection fraction 20-25%, echo, August, 2011  . CHF (congestive heart failure)     Chronic systolic  . Cardiomyopathy     EF was noted to be worse August, 2011.  No ischemic workup done at that time.  Decision made to treat LV dysfunction and followup and make other decisions later  . Shoulder injury     Patient fell (not syncope) April, 2012  . Prostate disorder   . Thyroid condition   . GERD (gastroesophageal reflux disease)   . Dysphagia   . Type II or unspecified type diabetes mellitus without mention of complication, not stated as uncontrolled   . Diverticulitis     Past Surgical History  Procedure Date  . Coronary  artery bypass graft     december 2008  . Tonsillectomy   . Rotator cuff repair   . Myrinfoplasty   . Colonoscopy 04/2001    History reviewed. No pertinent family history. Social History:  reports that he quit smoking about 32 years ago. His smoking use included Cigarettes. He has a 9 pack-year smoking history. He has never used smokeless tobacco. He reports that he does not drink alcohol or use illicit drugs.  Allergies:  Allergies  Allergen Reactions  . Ace Inhibitors   . Nsaids   . Iodinated Diagnostic Agents Rash    Medications Prior to Admission  Medication Dose Route Frequency Provider Last Rate Last Dose  . 0.45 % sodium chloride infusion   Intravenous Once Rogene Houston, MD 20 mL/hr at 04/01/11 1421     Medications Prior to Admission  Medication Sig Dispense Refill  . alfuzosin (UROXATRAL) 10 MG 24 hr tablet Take 10 mg by mouth daily.       Marland Kitchen amLODipine (NORVASC) 10 MG tablet Take 10 mg by mouth daily.       Marland Kitchen aspirin 81 MG tablet Take 81 mg by mouth daily.       . carvedilol (COREG) 25 MG tablet Take 25 mg by mouth 2 (two) times daily with a meal.        .  furosemide (LASIX) 20 MG tablet Take 20 mg by mouth daily.        Marland Kitchen glipiZIDE (GLUCOTROL XL) 5 MG 24 hr tablet Take 1 tablet by mouth Daily.      . metFORMIN (GLUCOPHAGE) 500 MG tablet Take 1,000 mg by mouth 2 (two) times daily with a meal.       . olmesartan (BENICAR) 40 MG tablet Take 40 mg by mouth daily.       Marland Kitchen omeprazole (PRILOSEC) 40 MG capsule Take 40 mg by mouth daily.       . simvastatin (ZOCOR) 20 MG tablet Take 20 mg by mouth at bedtime.       . nitroGLYCERIN (NITROSTAT) 0.4 MG SL tablet Place 0.4 mg under the tongue every 5 (five) minutes as needed.          No results found for this or any previous visit (from the past 48 hour(s)). No results found.  Review of Systems  Constitutional: Negative for weight loss.  Gastrointestinal: Negative for nausea, vomiting, abdominal pain, diarrhea, constipation,  blood in stool and melena. Heartburn: occasional heartburn.    Blood pressure 168/75, pulse 54, temperature 98.6 F (37 C), temperature source Oral, resp. rate 19, height 5\' 9"  (1.753 m), weight 175 lb (79.379 kg), SpO2 95.00%. Physical Exam  Constitutional: He appears well-developed and well-nourished.  HENT:  Mouth/Throat: Oropharynx is clear and moist.  Eyes: Conjunctivae are normal. No scleral icterus.  Neck: No thyromegaly present.  Cardiovascular: Normal rate, regular rhythm and normal heart sounds.   No murmur heard. Respiratory: Effort normal and breath sounds normal.  GI: Soft. Bowel sounds are normal. He exhibits no distension and no mass. There is no tenderness.  Musculoskeletal: He exhibits no edema.  Lymphadenopathy:    He has no cervical adenopathy.  Neurological: He is alert.  Skin: Skin is warm and dry.     Assessment/Plan Solid food dysphagia. EGD and ED.  REHMAN,NAJEEB U 04/01/2011, 3:18 PM

## 2011-04-15 ENCOUNTER — Other Ambulatory Visit: Payer: Self-pay | Admitting: Cardiology

## 2011-05-24 ENCOUNTER — Ambulatory Visit (INDEPENDENT_AMBULATORY_CARE_PROVIDER_SITE_OTHER): Payer: Medicare Other | Admitting: Internal Medicine

## 2011-07-15 ENCOUNTER — Encounter: Payer: Self-pay | Admitting: Cardiology

## 2011-07-15 ENCOUNTER — Ambulatory Visit (INDEPENDENT_AMBULATORY_CARE_PROVIDER_SITE_OTHER): Payer: Medicare Other | Admitting: Cardiology

## 2011-07-15 DIAGNOSIS — I2510 Atherosclerotic heart disease of native coronary artery without angina pectoris: Secondary | ICD-10-CM

## 2011-07-15 DIAGNOSIS — R9430 Abnormal result of cardiovascular function study, unspecified: Secondary | ICD-10-CM

## 2011-07-15 DIAGNOSIS — IMO0002 Reserved for concepts with insufficient information to code with codable children: Secondary | ICD-10-CM

## 2011-07-15 DIAGNOSIS — R0989 Other specified symptoms and signs involving the circulatory and respiratory systems: Secondary | ICD-10-CM

## 2011-07-15 NOTE — Assessment & Plan Note (Signed)
The patient's ejection fraction was 35-40% in April, 2012. He is on appropriate medications. I've chosen not to proceed with any other workup.

## 2011-07-15 NOTE — Assessment & Plan Note (Signed)
Coronary disease is stable. No change in therapy. 

## 2011-07-15 NOTE — Progress Notes (Signed)
HPI Patient is seen for cardiology follow up. I saw him last August, 2012. He underwent CABG in 2008. HEENT 2011 she had decreased LV function. We adjusted his medicines and these had a nice return. His EF is between 35 and 40%. He's not having any chest pain shortness of breath syncope or presyncope. Allergies  Allergen Reactions  . Ace Inhibitors   . Nsaids   . Iodinated Diagnostic Agents Rash    Current Outpatient Prescriptions  Medication Sig Dispense Refill  . amLODipine (NORVASC) 10 MG tablet TAKE 1 TABLET DAILY  90 tablet  6  . aspirin 81 MG tablet Take 81 mg by mouth daily.       . carvedilol (COREG) 25 MG tablet Take 25 mg by mouth 2 (two) times daily with a meal.        . furosemide (LASIX) 20 MG tablet Take 20 mg by mouth daily.        Marland Kitchen glipiZIDE (GLUCOTROL XL) 5 MG 24 hr tablet Take 1 tablet by mouth Daily.      . metFORMIN (GLUCOPHAGE) 500 MG tablet Take 1,000 mg by mouth 2 (two) times daily with a meal.       . nitroGLYCERIN (NITROSTAT) 0.4 MG SL tablet Place 0.4 mg under the tongue every 5 (five) minutes as needed.        Marland Kitchen olmesartan (BENICAR) 40 MG tablet Take 40 mg by mouth daily.       . simvastatin (ZOCOR) 20 MG tablet Take 20 mg by mouth at bedtime.       Marland Kitchen alfuzosin (UROXATRAL) 10 MG 24 hr tablet Take 10 mg by mouth daily.       . pantoprazole (PROTONIX) 40 MG tablet Take 1 tablet (40 mg total) by mouth daily.  60 tablet  3    History   Social History  . Marital Status: Married    Spouse Name: N/A    Number of Children: N/A  . Years of Education: N/A   Occupational History  . Not on file.   Social History Main Topics  . Smoking status: Former Smoker -- 0.3 packs/day for 30 years    Types: Cigarettes    Quit date: 06/07/1978  . Smokeless tobacco: Never Used   Comment: 40 yrs ago  . Alcohol Use: No  . Drug Use: No  . Sexually Active: Not on file   Other Topics Concern  . Not on file   Social History Narrative  . No narrative on file    No  family history on file.  Past Medical History  Diagnosis Date  . Nipple pain     resolved october 2010  . Leg cramps     night time  . Burping     with hiccups ...question reflux... improved 03/2009  . ACE inhibitor intolerance     ...tolerates ARB  . GI bleeding     ...lower...2002  . Allergic reaction to contrast dye   . Renal insufficiency april 15,2010    ...mild...creatine 1.5.Marland Kitchen  Marland Kitchen Diabetes mellitus     for about 20 yrs.  . Dyslipidemia   . Hypertension   . Atrial fibrillation     atrial fibrillation ..postopCABG....brief treatmentamiodarone...stopped.Marland Kitchen.NSR  . Ejection fraction < 50%     EF 40-45%, echo, December, 2008 / EF 20-25%, echo, August, 2011 /  EF 35-40%, echo, April, 2012  . Mitral regurgitation     ...mild..echo...2008...mild/moderate by echo 01/2010  . CAD (coronary artery disease)  EF 40-45%, December, 2008  /  ejection fraction 20-25%, echo, August, 2011  . CHF (congestive heart failure)     Chronic systolic  . Cardiomyopathy     EF was noted to be worse August, 2011.  No ischemic workup done at that time.  Decision made to treat LV dysfunction and followup and make other decisions later  . Shoulder injury     Patient fell (not syncope) April, 2012  . Prostate disorder   . Thyroid condition   . GERD (gastroesophageal reflux disease)   . Dysphagia   . Type II or unspecified type diabetes mellitus without mention of complication, not stated as uncontrolled   . Diverticulitis     Past Surgical History  Procedure Date  . Coronary artery bypass graft     december 2008  . Tonsillectomy   . Rotator cuff repair   . Myrinfoplasty   . Colonoscopy 04/2001    ROS  Patient denies fever, chills, headache, sweats, rash, change in vision, change in hearing, chest pain, cough, nausea vomiting, urinary symptoms. All other systems are reviewed and are negative.  PHYSICAL EXAM Patient is here with his family. He is oriented to person time and place. Affect is  normal. There is no jugulovenous distention. Lungs are clear. Respiratory effort is nonlabored. Cardiac exam reveals S1 and S2. There no clicks or significant murmurs. The abdomen is soft. There is no peripheral edema. Filed Vitals:   07/15/11 1440  Pulse: 55  Height: 5\' 6"  (1.676 m)  Weight: 180 lb (81.647 kg)    EKG is done today and reviewed by me. There is nonspecific interventricular conduction delay. There is sinus rhythm. There is no significant change. There is sinus bradycardia. ASSESSMENT & PLAN

## 2011-07-15 NOTE — Patient Instructions (Signed)
Continue all current medications. Your physician wants you to follow up in: 6 months.  You will receive a reminder letter in the mail one-two months in advance.  If you don't receive a letter, please call our office to schedule the follow up appointment   

## 2011-08-05 ENCOUNTER — Other Ambulatory Visit: Payer: Self-pay | Admitting: *Deleted

## 2011-08-05 MED ORDER — SIMVASTATIN 20 MG PO TABS: 20.0000 mg | ORAL_TABLET | Freq: Every day | ORAL | Status: AC

## 2011-08-05 MED ORDER — AMLODIPINE BESYLATE 10 MG PO TABS: 10.0000 mg | ORAL_TABLET | Freq: Every day | ORAL | Status: DC

## 2011-08-05 MED ORDER — OLMESARTAN MEDOXOMIL 40 MG PO TABS: 40.0000 mg | ORAL_TABLET | Freq: Every day | ORAL | Status: AC

## 2011-08-05 MED ORDER — PANTOPRAZOLE SODIUM 40 MG PO TBEC: 40.0000 mg | DELAYED_RELEASE_TABLET | Freq: Every day | ORAL | Status: DC

## 2011-08-05 MED ORDER — FUROSEMIDE 20 MG PO TABS: 20.0000 mg | ORAL_TABLET | Freq: Every day | ORAL | Status: DC

## 2011-08-05 MED ORDER — CARVEDILOL 25 MG PO TABS: 25.0000 mg | ORAL_TABLET | Freq: Two times a day (BID) | ORAL | Status: DC

## 2011-08-10 ENCOUNTER — Other Ambulatory Visit: Payer: Self-pay | Admitting: *Deleted

## 2011-08-10 MED ORDER — AMLODIPINE BESYLATE 10 MG PO TABS: 10.0000 mg | ORAL_TABLET | Freq: Every day | ORAL | Status: DC

## 2011-08-17 ENCOUNTER — Other Ambulatory Visit (INDEPENDENT_AMBULATORY_CARE_PROVIDER_SITE_OTHER): Payer: Self-pay | Admitting: *Deleted

## 2011-08-17 ENCOUNTER — Encounter (INDEPENDENT_AMBULATORY_CARE_PROVIDER_SITE_OTHER): Payer: Self-pay | Admitting: *Deleted

## 2011-08-17 ENCOUNTER — Encounter (INDEPENDENT_AMBULATORY_CARE_PROVIDER_SITE_OTHER): Payer: Self-pay | Admitting: Internal Medicine

## 2011-08-17 ENCOUNTER — Ambulatory Visit (INDEPENDENT_AMBULATORY_CARE_PROVIDER_SITE_OTHER): Payer: Medicare Other | Admitting: Internal Medicine

## 2011-08-17 DIAGNOSIS — R1310 Dysphagia, unspecified: Secondary | ICD-10-CM | POA: Insufficient documentation

## 2011-08-17 DIAGNOSIS — K222 Esophageal obstruction: Secondary | ICD-10-CM

## 2011-08-17 MED ORDER — PANTOPRAZOLE SODIUM 40 MG PO TBEC: 40.0000 mg | DELAYED_RELEASE_TABLET | Freq: Every day | ORAL | Status: DC

## 2011-08-17 NOTE — Patient Instructions (Signed)
Please take pantoprazole by mouth 30 minutes before breakfast and 30 minutes before evening meal. Hold aspirin for 2 days prior to procedure. Procedure to be scheduled

## 2011-08-17 NOTE — H&P (Signed)
Presenting complaint; solid food dysphagia. History of present illness; patient is 78 year old Caucasian male who presented with dysphagia last year and underwent EGD on 04/01/2011 and was found to have high-grade distal esophageal stricture along with ulceration and small sliding had hernia. This stricture was dilated to 16.5 mm with a balloon and he was begun on double dose PPI. He was also scheduled for office visit in 8 weeks. Patient felt so much better. He decided to stop his medication and canceled his appointment. He states he had no difficulty for about 3 months but now he is back to where he was prior to dilation. Over the weekend he was eating at a restaurant and had an episode of food impaction relieved with regurgitation. He has intermittent heartburn. He denies nausea vomiting abdominal pain or melena. He is using OTC Zantac on when necessary basis. He has a good appetite and his weight is stable. Current medications; Current Outpatient Prescriptions on File Prior to Visit  Medication Sig Dispense Refill  . amLODipine (NORVASC) 10 MG tablet Take 1 tablet (10 mg total) by mouth daily.  30 tablet  0  . aspirin 81 MG tablet Take 81 mg by mouth daily.       . carvedilol (COREG) 25 MG tablet Take 1 tablet (25 mg total) by mouth 2 (two) times daily with a meal.  180 tablet  3  . furosemide (LASIX) 20 MG tablet Take 1 tablet (20 mg total) by mouth daily.  90 tablet  3  . glipiZIDE (GLUCOTROL XL) 5 MG 24 hr tablet Take 1 tablet by mouth Daily.      . metFORMIN (GLUCOPHAGE) 500 MG tablet Take 1,000 mg by mouth 2 (two) times daily with a meal.       . nitroGLYCERIN (NITROSTAT) 0.4 MG SL tablet Place 0.4 mg under the tongue every 5 (five) minutes as needed.        Marland Kitchen olmesartan (BENICAR) 40 MG tablet Take 1 tablet (40 mg total) by mouth daily.  90 tablet  3  . simvastatin (ZOCOR) 20 MG tablet Take 1 tablet (20 mg total) by mouth at bedtime.  90 tablet  3   Past medical  history; Hypertension. Coronary artery disease. He had CABG in December 2008. History of atrial fibrillation; lately in normal sinus rhythm. History of CHF. Mitral regurgitation. Diabetes mellitus. Hyperlipidemia. Tonsillectomy at age 57 or 85. Surgery on right shoulder for torn cuff 15 years ago. Surgery on left ear but he still has tympanic perforation. GI bleed in November 2002 when he had colonoscopy. Bleed possibly secondary to diverticulosis but records not available. Allergies; NSAIDs, ACE inhibitors and iodine. Physical exam. BP 120/70  Pulse 74  Temp(Src) 97 F (36.1 C) (Oral)  Resp 12  Ht 5\' 8"  (1.727 m)  Wt 182 lb 9.6 oz (82.827 kg)  BMI 27.76 kg/m2 Conjunctiva is pink. Sclerae nonicteric. Oropharyngeal mucosa is normal. No neck masses or thyromegaly noted. Cardiac exam with regular rhythm normal S1 and S2. No murmur or gallop noted. Lungs are clear to auscultation. Abdomen is full but soft and nontender without organomegaly or masses. No LE edema or clubbing noted. Assessment; Patient is 78 year old Caucasian male who presents with solid food dysphagia. He underwent EGD with ED on 04/01/2011 revealing high grade stricture and ulcerative esophagitis. Stricture was dilated to 16.5 mm and he was begun on double dose PPI. Since he felt better he decided not to return for followup visit or continuous PPI and he is now back  to square one. He will need to have his upper GI tract reevaluated and dilated as appropriate. Suspect he has recurrent esophageal stricture secondary to chronic GERD. Recommendations; Esophagogastroduodenoscopy with esophageal dilation to be performed at [facility] in near future. Patient and his wife are agreeable for him to proceed with this evaluation. He will hold office aspirin for 2 days prior to procedure and should be able to go back on it on the day of procedure.

## 2011-08-18 NOTE — Progress Notes (Signed)
Presenting complaint; solid food dysphagia.  History of present illness; patient is 78 year old Caucasian male who presented with dysphagia last year and underwent EGD on 04/01/2011 and was found to have high-grade distal esophageal stricture along with ulceration and small sliding had hernia. This stricture was dilated to 16.5 mm with a balloon and he was begun on double dose PPI. He was also scheduled for office visit in 8 weeks. Patient felt so much better. He decided to stop his medication and canceled his appointment. He states he had no difficulty for about 3 months but now he is back to where he was prior to dilation. Over the weekend he was eating at a restaurant and had an episode of food impaction relieved with regurgitation. He has intermittent heartburn. He denies nausea vomiting abdominal pain or melena. He is using OTC Zantac on when necessary basis. He has a good appetite and his weight is stable.  Current medications;  Current Outpatient Prescriptions on File Prior to Visit   Medication  Sig  Dispense  Refill   .  amLODipine (NORVASC) 10 MG tablet  Take 1 tablet (10 mg total) by mouth daily.  30 tablet  0   .  aspirin 81 MG tablet  Take 81 mg by mouth daily.     .  carvedilol (COREG) 25 MG tablet  Take 1 tablet (25 mg total) by mouth 2 (two) times daily with a meal.  180 tablet  3   .  furosemide (LASIX) 20 MG tablet  Take 1 tablet (20 mg total) by mouth daily.  90 tablet  3   .  glipiZIDE (GLUCOTROL XL) 5 MG 24 hr tablet  Take 1 tablet by mouth Daily.     .  metFORMIN (GLUCOPHAGE) 500 MG tablet  Take 1,000 mg by mouth 2 (two) times daily with a meal.     .  nitroGLYCERIN (NITROSTAT) 0.4 MG SL tablet  Place 0.4 mg under the tongue every 5 (five) minutes as needed.     Marland Kitchen  olmesartan (BENICAR) 40 MG tablet  Take 1 tablet (40 mg total) by mouth daily.  90 tablet  3   .  simvastatin (ZOCOR) 20 MG tablet  Take 1 tablet (20 mg total) by mouth at bedtime.  90 tablet  3    Past medical  history;  Hypertension.  Coronary artery disease. He had CABG in December 2008.  History of atrial fibrillation; lately in normal sinus rhythm.  History of CHF.  Mitral regurgitation.  Diabetes mellitus.  Hyperlipidemia.  Tonsillectomy at age 44 or 58.  Surgery on right shoulder for torn cuff 15 years ago.  Surgery on left ear but he still has tympanic perforation.  GI bleed in November 2002 when he had colonoscopy. Bleed possibly secondary to diverticulosis but records not available.  Allergies;  NSAIDs, ACE inhibitors and iodine.  Physical exam.  BP 120/70  Pulse 74  Temp(Src) 97 F (36.1 C) (Oral)  Resp 12  Ht 5\' 8"  (1.727 m)  Wt 182 lb 9.6 oz (82.827 kg)  BMI 27.76 kg/m2  Conjunctiva is pink. Sclerae nonicteric.  Oropharyngeal mucosa is normal.  No neck masses or thyromegaly noted.  Cardiac exam with regular rhythm normal S1 and S2. No murmur or gallop noted.  Lungs are clear to auscultation.  Abdomen is full but soft and nontender without organomegaly or masses.  No LE edema or clubbing noted.  Assessment;  Patient is 78 year old Caucasian male who presents with solid food dysphagia. He  underwent EGD with ED on 04/01/2011 revealing high grade stricture and ulcerative esophagitis. Stricture was dilated to 16.5 mm and he was begun on double dose PPI. Since he felt better he decided not to return for followup visit or continuous PPI and he is now back to square one. He will need to have his upper GI tract reevaluated and dilated as appropriate.  Suspect he has recurrent esophageal stricture secondary to chronic GERD.  Recommendations;  Esophagogastroduodenoscopy with esophageal dilation to be performed at [facility] in near future.  Patient and his wife are agreeable for him to proceed with this evaluation.  He will hold office aspirin for 2 days prior to procedure and should be able to go back on it on the day of procedure.

## 2011-08-23 ENCOUNTER — Encounter (HOSPITAL_COMMUNITY): Payer: Self-pay | Admitting: Pharmacy Technician

## 2011-08-25 MED ORDER — SODIUM CHLORIDE 0.45 % IV SOLN
Freq: Once | INTRAVENOUS | Status: AC
  Administered 2011-08-26: 12:00:00 via INTRAVENOUS

## 2011-08-26 ENCOUNTER — Ambulatory Visit (HOSPITAL_COMMUNITY)
Admission: RE | Admit: 2011-08-26 | Discharge: 2011-08-26 | Disposition: A | Discharge status: Home Health | Payer: Medicare Other | Source: Ambulatory Visit | Attending: Internal Medicine | Admitting: Internal Medicine

## 2011-08-26 ENCOUNTER — Encounter (HOSPITAL_COMMUNITY): Payer: Self-pay | Admitting: *Deleted

## 2011-08-26 ENCOUNTER — Encounter (HOSPITAL_COMMUNITY)
Admission: RE | Disposition: A | Discharge status: Home Health | Payer: Self-pay | Source: Ambulatory Visit | Attending: Internal Medicine

## 2011-08-26 DIAGNOSIS — K222 Esophageal obstruction: Secondary | ICD-10-CM

## 2011-08-26 DIAGNOSIS — Z951 Presence of aortocoronary bypass graft: Secondary | ICD-10-CM | POA: Insufficient documentation

## 2011-08-26 DIAGNOSIS — E119 Type 2 diabetes mellitus without complications: Secondary | ICD-10-CM | POA: Insufficient documentation

## 2011-08-26 DIAGNOSIS — K449 Diaphragmatic hernia without obstruction or gangrene: Secondary | ICD-10-CM

## 2011-08-26 DIAGNOSIS — Z01812 Encounter for preprocedural laboratory examination: Secondary | ICD-10-CM | POA: Insufficient documentation

## 2011-08-26 DIAGNOSIS — R1310 Dysphagia, unspecified: Secondary | ICD-10-CM

## 2011-08-26 DIAGNOSIS — Z79899 Other long term (current) drug therapy: Secondary | ICD-10-CM | POA: Insufficient documentation

## 2011-08-26 DIAGNOSIS — I10 Essential (primary) hypertension: Secondary | ICD-10-CM | POA: Insufficient documentation

## 2011-08-26 SURGERY — ESOPHAGOGASTRODUODENOSCOPY (EGD) WITH ESOPHAGEAL DILATION
Anesthesia: Moderate Sedation

## 2011-08-26 MED ORDER — STERILE WATER FOR IRRIGATION IR SOLN
Status: DC | PRN
  Administered 2011-08-26: 13:00:00

## 2011-08-26 MED ORDER — BUTAMBEN-TETRACAINE-BENZOCAINE 2-2-14 % EX AERO
INHALATION_SPRAY | CUTANEOUS | Status: DC | PRN
  Administered 2011-08-26: 1 via TOPICAL

## 2011-08-26 MED ORDER — MIDAZOLAM HCL 5 MG/5ML IJ SOLN
INTRAMUSCULAR | Status: AC
  Filled 2011-08-26: qty 10

## 2011-08-26 MED ORDER — MEPERIDINE HCL 25 MG/ML IJ SOLN
INTRAMUSCULAR | Status: DC | PRN
Start: 2011-08-26 — End: 2011-08-26
  Administered 2011-08-26 (×2): 25 mg via INTRAVENOUS

## 2011-08-26 MED ORDER — MEPERIDINE HCL 50 MG/ML IJ SOLN
INTRAMUSCULAR | Status: AC
  Filled 2011-08-26: qty 1

## 2011-08-26 MED ORDER — MIDAZOLAM HCL 5 MG/5ML IJ SOLN
INTRAMUSCULAR | Status: DC | PRN
  Administered 2011-08-26: 2 mg via INTRAVENOUS
  Administered 2011-08-26 (×2): 1 mg via INTRAVENOUS

## 2011-08-26 NOTE — H&P (Signed)
This is an update to history and physical from August 17 3011. There is no interval change in patient's condition and he is back on his PPI. He is undergoing EGD with ED.

## 2011-08-26 NOTE — Discharge Instructions (Signed)
Resume medications and aspirin as before. No driving for 24 hours. Notify if swallowing difficulty recurs.  Esophagogastroduodenoscopy This is an endoscopic procedure (a procedure that uses a device like a flexible telescope) that allows your caregiver to view the upper stomach and small bowel. This test allows your caregiver to look at the esophagus. The esophagus carries food from your mouth to your stomach. They can also look at your duodenum. This is the first part of the small intestine that attaches to the stomach. This test is used to detect problems in the bowel such as ulcers and inflammation. PREPARATION FOR TEST Nothing to eat after midnight the day before the test. NORMAL FINDINGS Normal esophagus, stomach, and duodenum. Ranges for normal findings may vary among different laboratories and hospitals. You should always check with your doctor after having lab work or other tests done to discuss the meaning of your test results and whether your values are considered within normal limits. MEANING OF TEST  Your caregiver will go over the test results with you and discuss the importance and meaning of your results, as well as treatment options and the need for additional tests if necessary. OBTAINING THE TEST RESULTS It is your responsibility to obtain your test results. Ask the lab or department performing the test when and how you will get your results. Document Released: 09/24/2004 Document Revised: 05/13/2011 Document Reviewed: 05/03/2008 [facility] Patient Information 2012 [place].  Endoscopy Care After Please read the instructions outlined below and refer to this sheet in the next few weeks. These discharge instructions provide you with general information on caring for yourself after you leave the hospital. Your doctor may also give you specific instructions. While your treatment has been planned according to the most current medical practices available, unavoidable  complications occasionally occur. If you have any problems or questions after discharge, please call your doctor. HOME CARE INSTRUCTIONS Activity  You may resume your regular activity but move at a slower pace for the next 24 hours.   Take frequent rest periods for the next 24 hours.   Walking will help expel (get rid of) the air and reduce the bloated feeling in your abdomen.   No driving for 24 hours (because of the anesthesia (medicine) used during the test).   You may shower.   Do not sign any important legal documents or operate any machinery for 24 hours (because of the anesthesia used during the test).  Nutrition  Drink plenty of fluids.   You may resume your normal diet.   Begin with a light meal and progress to your normal diet.   Avoid alcoholic beverages for 24 hours or as instructed by your caregiver.  Medications You may resume your normal medications unless your caregiver tells you otherwise. What you can expect today  You may experience abdominal discomfort such as a feeling of fullness or "gas" pains.   You may experience a sore throat for 2 to 3 days. This is normal. Gargling with salt water may help this.  Follow-up Your doctor will discuss the results of your test with you. SEEK IMMEDIATE MEDICAL CARE IF:  You have excessive nausea (feeling sick to your stomach) and/or vomiting.   You have severe abdominal pain and distention (swelling).   You have trouble swallowing.   You have a temperature over 100 F (37.8 C).   You have rectal bleeding or vomiting of blood.  Document Released: 01/06/2004 Document Revised: 05/13/2011 Document Reviewed: 07/19/2007 [facility] Patient Information 2012 [place].

## 2011-08-26 NOTE — Op Note (Signed)
EGD PROCEDURE REPORT  PATIENT:  John Moore  MR#:  [id number] Birthdate:  May 27, 1928, 78 y.o., male Endoscopist:  Dr. Rogene Houston, MD Referred By:  Dr. Glenda Chroman, MD Procedure Date: 08/26/2011  Procedure:   EGD with ED.  Indications:  Patient is an 78 year old Caucasian male who underwent EGD back in October 2012 have ulcerative esophagitis with a high-grade stricture which was dilated to 16.5  mm with a balloon. Patient was advised to return for office visit but he did not. He felt so much better he decided to cut his medication. However now he has developed intermittent solid food dysphagia. He is therefore undergoing repeat dilation.            Informed Consent:  The risks, benefits, alternatives & imponderables which include, but are not limited to, bleeding, infection, perforation, drug reaction and potential missed lesion have been reviewed.  The potential for biopsy, lesion removal, esophageal dilation, etc. have also been discussed.  Questions have been answered.  All parties agreeable.  Please see history & physical in medical record for more information.  Medications:  Demerol 50 mg IV Versed 4 mg IV Cetacaine spray topically for oropharyngeal anesthesia  Description of procedure:  The endoscope was introduced through the mouth and advanced to the second portion of the duodenum without difficulty or limitations. The mucosal surfaces were surveyed very carefully during advancement of the scope and upon withdrawal.  Findings:  Esophagus:  Mucosa of the esophagus was normal previously identified ulcer at distal esophagus and GE junction it completely healed. The stricture noted at GE junction. GEJ:  35 cm Hiatus:  38 cm Stomach:  Stomach was empty and distended very well with insufflation. Folds in proximal stomach are normal. Examination of mucosa of body, antrum, pyloric channel, as well as angularis, fundus and cardia was normal. Duodenum:  Normal bulbar and post bulbar  mucosa.  Therapeutic/Diagnostic Maneuvers Performed:  Esophageal stricture was dilated with a balloon. Balloon dilator was passed through the scope. Guidewire was positioned the gastric lumen. Balloon dilator was positioned across the stricture by withdrawing scope into body of  esophagus.  Stricture was dilated to 15, 16.5 and finally to 18 mm resulting in mucosal disruption at two points. Balloon was deflated and withdrawn. Stomach was decompressed and the scope was withdrawn. Patient tolerated the procedure well.  Complications:  None  Impression: Healed esophagitis. Stricture at GE junction and dilated with a balloon to 18 mm. Small sliding hiatal hernia.  Recommendations:  Continue anti-reflux measures and PPI. Notify if and when dysphagia recurs.  [name]  08/26/2011  1:32 PM  CC: Dr. Glenda Chroman., MD, MD & Dr. Rayne Du ref. provider found

## 2011-09-01 ENCOUNTER — Encounter (INDEPENDENT_AMBULATORY_CARE_PROVIDER_SITE_OTHER): Payer: Self-pay

## 2011-10-26 ENCOUNTER — Telehealth (INDEPENDENT_AMBULATORY_CARE_PROVIDER_SITE_OTHER): Payer: Self-pay | Admitting: Internal Medicine

## 2011-10-26 DIAGNOSIS — K219 Gastro-esophageal reflux disease without esophagitis: Secondary | ICD-10-CM

## 2011-10-26 MED ORDER — DEXLANSOPRAZOLE 60 MG PO CPDR: 60.0000 mg | DELAYED_RELEASE_CAPSULE | Freq: Every day | ORAL | Status: DC

## 2011-10-26 NOTE — Telephone Encounter (Signed)
Rx for Dexilant will be eprescribed to mail order

## 2011-10-27 ENCOUNTER — Telehealth (INDEPENDENT_AMBULATORY_CARE_PROVIDER_SITE_OTHER): Payer: Self-pay | Admitting: Internal Medicine

## 2011-10-27 NOTE — Telephone Encounter (Signed)
Opened in error

## 2012-01-14 ENCOUNTER — Encounter: Payer: Self-pay | Admitting: Cardiology

## 2012-01-14 ENCOUNTER — Ambulatory Visit (INDEPENDENT_AMBULATORY_CARE_PROVIDER_SITE_OTHER): Payer: Medicare Other | Admitting: Cardiology

## 2012-01-14 VITALS — BP 167/75 | HR 55 | Ht 68.0 in | Wt 177.1 lb

## 2012-01-14 DIAGNOSIS — I10 Essential (primary) hypertension: Secondary | ICD-10-CM

## 2012-01-14 DIAGNOSIS — Z951 Presence of aortocoronary bypass graft: Secondary | ICD-10-CM | POA: Insufficient documentation

## 2012-01-14 DIAGNOSIS — R0989 Other specified symptoms and signs involving the circulatory and respiratory systems: Secondary | ICD-10-CM

## 2012-01-14 DIAGNOSIS — I2510 Atherosclerotic heart disease of native coronary artery without angina pectoris: Secondary | ICD-10-CM

## 2012-01-14 DIAGNOSIS — R9430 Abnormal result of cardiovascular function study, unspecified: Secondary | ICD-10-CM

## 2012-01-14 NOTE — Assessment & Plan Note (Signed)
Patient has known coronary disease he is post CABG. He's not having any ischemic symptoms.

## 2012-01-14 NOTE — Patient Instructions (Addendum)
Your physician recommends that you schedule a follow-up appointment in: 6 months with Dr. Ron Parker. You will receive a reminder letter in the mail in about 4 months reminding you to call and schedule your appointment. If you don't receive this letter, please contact our office.  Your physician recommends that you continue on your current medications as directed. Please refer to the Current Medication list given to you today.   Your physician recommends that you schedule an appointment for a nurse visit for BP check to compare your home monitor and verify accuracy of home monitor. Please bring your monitor with you to this visit.

## 2012-01-14 NOTE — Assessment & Plan Note (Signed)
The patient's most recent EF was 35-40%. He is on appropriate medications. No change in therapy.

## 2012-01-14 NOTE — Progress Notes (Signed)
HPI Patient is seen for cardiology followup for hypertension and cardiomyopathy. He is doing well. He's here with a family member. He underwent CABG in the past. Over time unfortunately his ejection fraction appeared to decrease. Most recently he was 35-40%. Usually his blood pressure is controlled. Today is a little high.  Allergies  Allergen Reactions  . Ace Inhibitors Other (See Comments)    unknown  . Nsaids Other (See Comments)    unknown  . Iodinated Diagnostic Agents Rash    Current Outpatient Prescriptions  Medication Sig Dispense Refill  . amLODipine (NORVASC) 10 MG tablet Take 1 tablet (10 mg total) by mouth daily.  30 tablet  0  . aspirin EC 81 MG tablet Take 81 mg by mouth daily.      . carvedilol (COREG) 25 MG tablet Take 1 tablet (25 mg total) by mouth 2 (two) times daily with a meal.  180 tablet  3  . furosemide (LASIX) 20 MG tablet Take 1 tablet (20 mg total) by mouth daily.  90 tablet  3  . glipiZIDE (GLUCOTROL XL) 5 MG 24 hr tablet Take 1 tablet by mouth 2 (two) times daily.       . metFORMIN (GLUCOPHAGE) 500 MG tablet Take 1,000 mg by mouth 2 (two) times daily with a meal.       . olmesartan (BENICAR) 40 MG tablet Take 1 tablet (40 mg total) by mouth daily.  90 tablet  3  . pantoprazole (PROTONIX) 40 MG tablet Take 1 tablet (40 mg total) by mouth daily.  60 tablet  3  . simvastatin (ZOCOR) 20 MG tablet Take 1 tablet (20 mg total) by mouth at bedtime.  90 tablet  3  . dexlansoprazole (DEXILANT) 60 MG capsule Take 1 capsule (60 mg total) by mouth daily.  90 capsule  3  . nitroGLYCERIN (NITROSTAT) 0.4 MG SL tablet Place 0.4 mg under the tongue every 5 (five) minutes as needed. For chest pains        History   Social History  . Marital Status: Married    Spouse Name: N/A    Number of Children: N/A  . Years of Education: N/A   Occupational History  . Not on file.   Social History Main Topics  . Smoking status: Former Smoker -- 0.3 packs/day for 30 years   Types: Cigarettes    Quit date: 06/07/1978  . Smokeless tobacco: Never Used   Comment: 40 yrs ago  . Alcohol Use: No  . Drug Use: No  . Sexually Active: Not on file   Other Topics Concern  . Not on file   Social History Narrative  . No narrative on file    No family history on file.  Past Medical History  Diagnosis Date  . Nipple pain     resolved october 2010  . Leg cramps     night time  . Burping     with hiccups ...question reflux... improved 03/2009  . ACE inhibitor intolerance     ...tolerates ARB  . GI bleeding     ...lower...2002  . Allergic reaction to contrast dye   . Renal insufficiency april 15,2010    ...mild...creatine 1.5.Marland Kitchen  Marland Kitchen Diabetes mellitus     for about 20 yrs.  . Dyslipidemia   . Hypertension   . Atrial fibrillation     atrial fibrillation ..postopCABG....brief treatmentamiodarone...stopped.Marland Kitchen.NSR  . Ejection fraction < 50%     EF 40-45%, echo, December, 2008 / EF 20-25%, echo,  August, 2011 /  EF 35-40%, echo, April, 2012  . Mitral regurgitation     ...mild..echo...2008...mild/moderate by echo 01/2010  . CAD (coronary artery disease)     EF 40-45%, December, 2008  /  ejection fraction 20-25%, echo, August, 2011  . CHF (congestive heart failure)     Chronic systolic  . Cardiomyopathy     EF was noted to be worse August, 2011.  No ischemic workup done at that time.  Decision made to treat LV dysfunction and followup and make other decisions later  . Shoulder injury     Patient fell (not syncope) April, 2012  . Prostate disorder   . Thyroid condition   . GERD (gastroesophageal reflux disease)   . Dysphagia   . Type II or unspecified type diabetes mellitus without mention of complication, not stated as uncontrolled   . Diverticulitis   . Hx of CABG     2008    Past Surgical History  Procedure Date  . Coronary artery bypass graft     december 2008  . Tonsillectomy   . Rotator cuff repair   . Myrinfoplasty   . Colonoscopy 04/2001     ROS   Patient denies fever, chills, headache, sweats, rash, change in vision, change in hearing, chest pain, cough, nausea vomiting, urinary symptoms. All other systems are reviewed and are negative.  PHYSICAL EXAM  Patient looks great. He is oriented to person time and place. Affect is normal. He's here with his family member. There is no jugulovenous distention. Lungs are clear. Respiratory effort is nonlabored. Cardiac exam reveals S1 and S2. There are no clicks or significant murmurs. The abdomen is soft. Is no peripheral edema.  Filed Vitals:   01/14/12 1327  BP: 167/75  Pulse: 55  Height: 5\' 8"  (1.727 m)  Weight: 177 lb 1.9 oz (80.341 kg)  SpO2: 97%     ASSESSMENT & PLAN

## 2012-01-14 NOTE — Assessment & Plan Note (Signed)
Blood pressure is low high today. We'll make an appointment for him to bring his home blood pressure cuff back and have our nurses compare it and check his procedure. He will then contact me if his blood pressure runs higher than 140 on a regular basis.

## 2012-01-19 ENCOUNTER — Ambulatory Visit (INDEPENDENT_AMBULATORY_CARE_PROVIDER_SITE_OTHER): Payer: Medicare Other | Admitting: *Deleted

## 2012-01-19 VITALS — BP 149/66 | HR 57

## 2012-01-19 DIAGNOSIS — I10 Essential (primary) hypertension: Secondary | ICD-10-CM

## 2012-01-19 NOTE — Progress Notes (Signed)
Patient presents to office for f/u BP check due to elevation at office visit. Patient has taken all doses of medications and no side effects noted. Patient denies dizziness,chest pain or sob. Patient brought home meter with him today and both results placed into chart for review.

## 2012-03-16 ENCOUNTER — Other Ambulatory Visit: Payer: Self-pay | Admitting: Dermatology

## 2012-07-28 ENCOUNTER — Ambulatory Visit: Payer: Medicare Other | Admitting: Cardiology

## 2012-08-21 ENCOUNTER — Ambulatory Visit (INDEPENDENT_AMBULATORY_CARE_PROVIDER_SITE_OTHER): Payer: Medicare Other | Admitting: Cardiology

## 2012-08-21 ENCOUNTER — Encounter: Payer: Self-pay | Admitting: Cardiology

## 2012-08-21 VITALS — BP 187/84 | HR 53 | Wt 185.0 lb

## 2012-08-21 DIAGNOSIS — I10 Essential (primary) hypertension: Secondary | ICD-10-CM

## 2012-08-21 DIAGNOSIS — I4891 Unspecified atrial fibrillation: Secondary | ICD-10-CM

## 2012-08-21 DIAGNOSIS — I059 Rheumatic mitral valve disease, unspecified: Secondary | ICD-10-CM

## 2012-08-21 DIAGNOSIS — I428 Other cardiomyopathies: Secondary | ICD-10-CM

## 2012-08-21 NOTE — Assessment & Plan Note (Signed)
We know that he has mild/moderate mitral regurgitation by echo. No further workup at this time.

## 2012-08-21 NOTE — Progress Notes (Signed)
HPI   The patient is seen for followup of his cardiomyopathy. He underwent CABG in the past. Over time he did have a decrease in his ejection fraction. Most recently he was in the range of 35-40%. His meds have been adjusted for this. He continues to have mild increase in blood pressure. He says that his pressure at home is normal. I'm concerned that it is actually running higher than we want. He feels well. He's not having any chest pain or shortness of breath.  Allergies  Allergen Reactions  . Ace Inhibitors Other (See Comments)    unknown  . Nsaids Other (See Comments)    unknown  . Iodinated Diagnostic Agents Rash    Current Outpatient Prescriptions  Medication Sig Dispense Refill  . amLODipine (NORVASC) 10 MG tablet Take 1 tablet (10 mg total) by mouth daily.  30 tablet  0  . aspirin EC 81 MG tablet Take 81 mg by mouth daily.      . carvedilol (COREG) 25 MG tablet Take 1 tablet (25 mg total) by mouth 2 (two) times daily with a meal.  180 tablet  3  . dexlansoprazole (DEXILANT) 60 MG capsule Take 1 capsule (60 mg total) by mouth daily.  90 capsule  3  . furosemide (LASIX) 20 MG tablet Take 1 tablet (20 mg total) by mouth daily.  90 tablet  3  . glipiZIDE (GLUCOTROL XL) 5 MG 24 hr tablet Take 1 tablet by mouth 2 (two) times daily.       . metFORMIN (GLUCOPHAGE) 500 MG tablet Take 1,000 mg by mouth 2 (two) times daily with a meal.       . nitroGLYCERIN (NITROSTAT) 0.4 MG SL tablet Place 0.4 mg under the tongue every 5 (five) minutes as needed. For chest pains      . olmesartan (BENICAR) 40 MG tablet Take 1 tablet (40 mg total) by mouth daily.  90 tablet  3  . pantoprazole (PROTONIX) 40 MG tablet Take 1 tablet (40 mg total) by mouth daily.  60 tablet  3  . simvastatin (ZOCOR) 20 MG tablet Take 1 tablet (20 mg total) by mouth at bedtime.  90 tablet  3   No current facility-administered medications for this visit.    History   Social History  . Marital Status: Married    Spouse  Name: N/A    Number of Children: N/A  . Years of Education: N/A   Occupational History  . Not on file.   Social History Main Topics  . Smoking status: Former Smoker -- 0.30 packs/day for 30 years    Types: Cigarettes    Quit date: 06/07/1978  . Smokeless tobacco: Never Used     Comment: 40 yrs ago  . Alcohol Use: No  . Drug Use: No  . Sexually Active: Not on file   Other Topics Concern  . Not on file   Social History Narrative  . No narrative on file    History reviewed. No pertinent family history.  Past Medical History  Diagnosis Date  . Nipple pain     resolved october 2010  . Leg cramps     night time  . Burping     with hiccups ...question reflux... improved 03/2009  . ACE inhibitor intolerance     ...tolerates ARB  . GI bleeding     ...lower...2002  . Allergic reaction to contrast dye   . Renal insufficiency april 15,2010    ...mild...creatine 1.5.Marland Kitchen  Marland Kitchen  Diabetes mellitus     for about 20 yrs.  . Dyslipidemia   . Hypertension   . Atrial fibrillation     atrial fibrillation ..postopCABG....brief treatmentamiodarone...stopped.Marland Kitchen.NSR  . Ejection fraction < 50%     EF 40-45%, echo, December, 2008 / EF 20-25%, echo, August, 2011 /  EF 35-40%, echo, April, 2012  . Mitral regurgitation     ...mild..echo...2008...mild/moderate by echo 01/2010  . CAD (coronary artery disease)     EF 40-45%, December, 2008  /  ejection fraction 20-25%, echo, August, 2011  . CHF (congestive heart failure)     Chronic systolic  . Cardiomyopathy     EF was noted to be worse August, 2011.  No ischemic workup done at that time.  Decision made to treat LV dysfunction and followup and make other decisions later  . Shoulder injury     Patient fell (not syncope) April, 2012  . Prostate disorder   . Thyroid condition   . GERD (gastroesophageal reflux disease)   . Dysphagia   . Type II or unspecified type diabetes mellitus without mention of complication, not stated as uncontrolled   .  Diverticulitis   . Hx of CABG     2008    Past Surgical History  Procedure Laterality Date  . Coronary artery bypass graft      december 2008  . Tonsillectomy    . Rotator cuff repair    . Myrinfoplasty    . Colonoscopy  04/2001    Patient Active Problem List  Diagnosis  . Leg cramps  . ACE inhibitor intolerance  . GI bleeding  . Mitral regurgitation  . CAD (coronary artery disease)  . CHF (congestive heart failure)  . Cardiomyopathy  . Shoulder injury  . Burping  . Allergic reaction to contrast dye  . Renal insufficiency  . Dyslipidemia  . Hypertension  . Atrial fibrillation  . Ejection fraction < 50%  . Prostate disorder  . Thyroid condition  . Dysphagia  . Hx of CABG    ROS   Patient denies fever, chills, headache, sweats, rash, change in vision, change in hearing, chest pain, cough, nausea vomiting, urinary symptoms. All other systems are reviewed and are negative.  PHYSICAL EXAM  Patient is quite stable. He's here with a family member. He is oriented to person time and place. Affect is normal. Lungs are clear. Respiratory effort is nonlabored. Cardiac exam reveals S1 and S2. There are no clicks or significant murmurs. His abdomen is soft. There is no peripheral edema.  Filed Vitals:   08/21/12 1334  BP: 187/84  Pulse: 53  Weight: 185 lb (83.915 kg)   EKG is done today and reviewed by me. He has an old nonspecific interventricular conduction delay. Heart rate is 54.  ASSESSMENT & PLAN

## 2012-08-21 NOTE — Patient Instructions (Signed)
Continue all current medications. Your physician wants you to follow up in: 6 months.  You will receive a reminder letter in the mail one-two months in advance.  If you don't receive a letter, please call our office to schedule the follow up appointment   

## 2012-08-21 NOTE — Assessment & Plan Note (Signed)
The patient's blood pressure is higher than on would like to see. He tells me that it's normal at home. He also tells me that he will be seeing Dr. Woody Seller soon. I will send a copy of this note to Dr. Woody Seller. I would suggest that if his blood pressure remains elevated that his medicines be adjusted further. I am hesitant to use spironolactone. He does have some renal insufficiency. My next choice would be to add a long-acting nitrate. This would be appropriate for his left ventricle and for his blood pressure.

## 2012-08-21 NOTE — Assessment & Plan Note (Signed)
The patient is receiving appropriate medications for his cardiomyopathy. I have not yet put him on spironolactone. He is on a small dose of Lasix.

## 2012-09-26 ENCOUNTER — Other Ambulatory Visit: Payer: Self-pay | Admitting: *Deleted

## 2012-09-26 MED ORDER — AMLODIPINE BESYLATE 10 MG PO TABS: 10.0000 mg | ORAL_TABLET | Freq: Every day | ORAL | Status: DC

## 2012-10-24 ENCOUNTER — Other Ambulatory Visit: Payer: Self-pay | Admitting: *Deleted

## 2012-10-24 MED ORDER — CARVEDILOL 25 MG PO TABS: 25.0000 mg | ORAL_TABLET | Freq: Two times a day (BID) | ORAL | Status: DC

## 2012-10-24 MED ORDER — FUROSEMIDE 20 MG PO TABS: 20.0000 mg | ORAL_TABLET | Freq: Every day | ORAL | Status: DC

## 2012-10-24 MED ORDER — AMLODIPINE BESYLATE 10 MG PO TABS: 10.0000 mg | ORAL_TABLET | Freq: Every day | ORAL | Status: DC

## 2012-11-09 ENCOUNTER — Encounter: Payer: Self-pay | Admitting: Cardiology

## 2013-01-24 ENCOUNTER — Ambulatory Visit (INDEPENDENT_AMBULATORY_CARE_PROVIDER_SITE_OTHER): Payer: Medicare Other | Admitting: Cardiology

## 2013-01-24 ENCOUNTER — Encounter: Payer: Self-pay | Admitting: Cardiology

## 2013-01-24 VITALS — BP 136/72 | HR 59 | Ht 68.0 in | Wt 179.0 lb

## 2013-01-24 DIAGNOSIS — I509 Heart failure, unspecified: Secondary | ICD-10-CM

## 2013-01-24 DIAGNOSIS — I5022 Chronic systolic (congestive) heart failure: Secondary | ICD-10-CM

## 2013-01-24 DIAGNOSIS — I10 Essential (primary) hypertension: Secondary | ICD-10-CM

## 2013-01-24 NOTE — Assessment & Plan Note (Signed)
Blood pressures controlled. No change in therapy. 

## 2013-01-24 NOTE — Patient Instructions (Addendum)

## 2013-01-24 NOTE — Assessment & Plan Note (Signed)
Patient is on appropriate medications. He's doing well and feeling well. No change in his medicines. No further workup.

## 2013-01-24 NOTE — Progress Notes (Signed)
HPI  Patient is seen today to followup his cardiomyopathy. He underwent CABG in the past. His most recent echo showed an ejection fraction in the 35-40% range. Clinically he is stable. There is no reason to repeat an echo at this time. He's tolerating his medicines. I decided that I will not add spironolactone in his case.  Allergies  Allergen Reactions  . Ace Inhibitors Other (See Comments)    unknown  . Nsaids Other (See Comments)    unknown  . Iodinated Diagnostic Agents Rash    Current Outpatient Prescriptions  Medication Sig Dispense Refill  . amLODipine (NORVASC) 10 MG tablet Take 1 tablet (10 mg total) by mouth daily.  90 tablet  3  . aspirin EC 81 MG tablet Take 81 mg by mouth daily.      . carvedilol (COREG) 25 MG tablet Take 1 tablet (25 mg total) by mouth 2 (two) times daily with a meal.  180 tablet  3  . furosemide (LASIX) 20 MG tablet Take 1 tablet (20 mg total) by mouth daily.  90 tablet  3  . glipiZIDE (GLUCOTROL XL) 5 MG 24 hr tablet Take 1 tablet by mouth 2 (two) times daily.       . metFORMIN (GLUCOPHAGE) 500 MG tablet Take 1,000 mg by mouth 2 (two) times daily with a meal.       . nitroGLYCERIN (NITROSTAT) 0.4 MG SL tablet Place 0.4 mg under the tongue every 5 (five) minutes as needed. For chest pains      . olmesartan (BENICAR) 40 MG tablet Take 1 tablet (40 mg total) by mouth daily.  90 tablet  3  . simvastatin (ZOCOR) 20 MG tablet Take 1 tablet (20 mg total) by mouth at bedtime.  90 tablet  3   No current facility-administered medications for this visit.    History   Social History  . Marital Status: Married    Spouse Name: N/A    Number of Children: N/A  . Years of Education: N/A   Occupational History  . Not on file.   Social History Main Topics  . Smoking status: Former Smoker -- 0.30 packs/day for 30 years    Types: Cigarettes    Quit date: 06/07/1978  . Smokeless tobacco: Never Used     Comment: 40 yrs ago  . Alcohol Use: No  . Drug Use: No    . Sexual Activity: Not on file   Other Topics Concern  . Not on file   Social History Narrative  . No narrative on file    History reviewed. No pertinent family history.  Past Medical History  Diagnosis Date  . Nipple pain     resolved october 2010  . Leg cramps     night time  . Burping     with hiccups ...question reflux... improved 03/2009  . ACE inhibitor intolerance     ...tolerates ARB  . GI bleeding     ...lower...2002  . Allergic reaction to contrast dye   . Renal insufficiency april 15,2010    ...mild...creatine 1.5.John Moore  John Moore Diabetes mellitus     for about 20 yrs.  . Dyslipidemia   . Hypertension   . Atrial fibrillation     atrial fibrillation ..postopCABG....brief treatmentamiodarone...stopped.John Moore.NSR  . Ejection fraction < 50%     EF 40-45%, echo, December, 2008 / EF 20-25%, echo, August, 2011 /  EF 35-40%, echo, April, 2012  . Mitral regurgitation     ...mild..echo...2008...mild/moderate by echo 01/2010  .  CAD (coronary artery disease)     EF 40-45%, December, 2008  /  ejection fraction 20-25%, echo, August, 2011  . CHF (congestive heart failure)     Chronic systolic  . Cardiomyopathy     EF was noted to be worse August, 2011.  No ischemic workup done at that time.  Decision made to treat LV dysfunction and followup and make other decisions later  . Shoulder injury     Patient fell (not syncope) April, 2012  . Prostate disorder   . Thyroid condition   . GERD (gastroesophageal reflux disease)   . Dysphagia   . Type II or unspecified type diabetes mellitus without mention of complication, not stated as uncontrolled   . Diverticulitis   . Hx of CABG     2008    Past Surgical History  Procedure Laterality Date  . Coronary artery bypass graft      december 2008  . Tonsillectomy    . Rotator cuff repair    . Myrinfoplasty    . Colonoscopy  04/2001    Patient Active Problem List   Diagnosis Date Noted  . Hx of CABG   . Dysphagia 08/17/2011  .  Ejection fraction < 50%   . Prostate disorder   . Thyroid condition   . Burping   . Allergic reaction to contrast dye   . Dyslipidemia   . Hypertension   . Atrial fibrillation   . Leg cramps   . ACE inhibitor intolerance   . GI bleeding   . Mitral regurgitation   . CAD (coronary artery disease)   . CHF (congestive heart failure)   . Cardiomyopathy   . Shoulder injury   . Renal insufficiency 09/19/2008    ROS   Patient denies fever, chills, headache, sweats, rash, change in vision, change in hearing, chest pain, cough, nausea vomiting, urinary symptoms. All of the systems are reviewed and are negative.  PHYSICAL EXAM  Patient is oriented to person time and place. Affect is normal. He is here with his family member. There is no jugulovenous distention. Lungs are clear. Respiratory effort is nonlabored. Cardiac exam reveals S1-S2. There no clicks or significant murmurs. The abdomen is soft. There is no peripheral edema.  Filed Vitals:   01/24/13 1059  BP: 136/72  Pulse: 59  Height: 5\' 8"  (1.727 m)  Weight: 179 lb (81.194 kg)  SpO2: 97%    ASSESSMENT & PLAN

## 2013-01-25 ENCOUNTER — Other Ambulatory Visit (INDEPENDENT_AMBULATORY_CARE_PROVIDER_SITE_OTHER): Payer: Self-pay | Admitting: Internal Medicine

## 2013-07-23 ENCOUNTER — Ambulatory Visit: Payer: Medicare Other | Admitting: Cardiology

## 2013-08-07 ENCOUNTER — Ambulatory Visit (INDEPENDENT_AMBULATORY_CARE_PROVIDER_SITE_OTHER): Payer: Medicare Other | Admitting: Cardiology

## 2013-08-07 ENCOUNTER — Encounter: Payer: Self-pay | Admitting: Cardiology

## 2013-08-07 VITALS — BP 157/66 | HR 53 | Ht 69.0 in | Wt 177.0 lb

## 2013-08-07 DIAGNOSIS — I340 Nonrheumatic mitral (valve) insufficiency: Secondary | ICD-10-CM

## 2013-08-07 DIAGNOSIS — I509 Heart failure, unspecified: Secondary | ICD-10-CM

## 2013-08-07 DIAGNOSIS — I10 Essential (primary) hypertension: Secondary | ICD-10-CM

## 2013-08-07 DIAGNOSIS — I059 Rheumatic mitral valve disease, unspecified: Secondary | ICD-10-CM

## 2013-08-07 DIAGNOSIS — I4891 Unspecified atrial fibrillation: Secondary | ICD-10-CM

## 2013-08-07 DIAGNOSIS — I429 Cardiomyopathy, unspecified: Secondary | ICD-10-CM

## 2013-08-07 DIAGNOSIS — I428 Other cardiomyopathies: Secondary | ICD-10-CM

## 2013-08-07 DIAGNOSIS — I5022 Chronic systolic (congestive) heart failure: Secondary | ICD-10-CM

## 2013-08-07 NOTE — Progress Notes (Signed)
HPI  Patient is seen today for a six-month followup of his cardiomyopathy. He underwent CABG in the past. His most recent echo showed an EF in the range of 35-40%. Clinically he has actually done very well. He is on a beta blocker and an ACE inhibitor. After careful consideration I chose not to use spironolactone. He's doing very well. He is fully active.  Allergies  Allergen Reactions  . Ace Inhibitors Other (See Comments)    unknown  . Nsaids Other (See Comments)    unknown  . Iodinated Diagnostic Agents Rash    Current Outpatient Prescriptions  Medication Sig Dispense Refill  . amLODipine (NORVASC) 10 MG tablet Take 1 tablet (10 mg total) by mouth daily.  90 tablet  3  . aspirin EC 81 MG tablet Take 81 mg by mouth daily.      . carvedilol (COREG) 25 MG tablet Take 12.5 mg by mouth 2 (two) times daily with a meal.      . DEXILANT 60 MG capsule TAKE 1 CAPSULE EVERY DAY  90 capsule  1  . furosemide (LASIX) 20 MG tablet Take 1 tablet (20 mg total) by mouth daily.  90 tablet  3  . glipiZIDE (GLUCOTROL XL) 5 MG 24 hr tablet Take 1 tablet by mouth 2 (two) times daily.       . metFORMIN (GLUCOPHAGE) 500 MG tablet Take 1,000 mg by mouth 2 (two) times daily with a meal.       . olmesartan (BENICAR) 40 MG tablet Take 1 tablet (40 mg total) by mouth daily.  90 tablet  3  . simvastatin (ZOCOR) 20 MG tablet Take 1 tablet (20 mg total) by mouth at bedtime.  90 tablet  3  . nitroGLYCERIN (NITROSTAT) 0.4 MG SL tablet Place 0.4 mg under the tongue every 5 (five) minutes as needed. For chest pains       No current facility-administered medications for this visit.    History   Social History  . Marital Status: Married    Spouse Name: N/A    Number of Children: N/A  . Years of Education: N/A   Occupational History  . Not on file.   Social History Main Topics  . Smoking status: Former Smoker -- 0.30 packs/day for 30 years    Types: Cigarettes    Quit date: 06/07/1978  . Smokeless  tobacco: Never Used     Comment: 40 yrs ago  . Alcohol Use: No  . Drug Use: No  . Sexual Activity: Not on file   Other Topics Concern  . Not on file   Social History Narrative  . No narrative on file    History reviewed. No pertinent family history.  Past Medical History  Diagnosis Date  . Nipple pain     resolved october 2010  . Leg cramps     night time  . Burping     with hiccups ...question reflux... improved 03/2009  . ACE inhibitor intolerance     ...tolerates ARB  . GI bleeding     ...lower...2002  . Allergic reaction to contrast dye   . Renal insufficiency april 15,2010    ...mild...creatine 1.5.Marland Kitchen  Marland Kitchen Diabetes mellitus     for about 20 yrs.  . Dyslipidemia   . Hypertension   . Atrial fibrillation     atrial fibrillation ..postopCABG....brief treatmentamiodarone...stopped.Marland Kitchen.NSR  . Ejection fraction < 50%     EF 40-45%, echo, December, 2008 / EF 20-25%, echo, August, 2011 /  EF 35-40%, echo, April, 2012  . Mitral regurgitation     ...mild..echo...2008...mild/moderate by echo 01/2010  . CAD (coronary artery disease)     EF 40-45%, December, 2008  /  ejection fraction 20-25%, echo, August, 2011  . CHF (congestive heart failure)     Chronic systolic  . Cardiomyopathy     EF was noted to be worse August, 2011.  No ischemic workup done at that time.  Decision made to treat LV dysfunction and followup and make other decisions later  . Shoulder injury     Patient fell (not syncope) April, 2012  . Prostate disorder   . Thyroid condition   . GERD (gastroesophageal reflux disease)   . Dysphagia   . Type II or unspecified type diabetes mellitus without mention of complication, not stated as uncontrolled   . Diverticulitis   . Hx of CABG     2008    Past Surgical History  Procedure Laterality Date  . Coronary artery bypass graft      december 2008  . Tonsillectomy    . Rotator cuff repair    . Myrinfoplasty    . Colonoscopy  04/2001    Patient Active  Problem List   Diagnosis Date Noted  . Chronic systolic CHF (congestive heart failure) 01/24/2013  . Hx of CABG   . Dysphagia 08/17/2011  . Ejection fraction < 50%   . Prostate disorder   . Thyroid condition   . Burping   . Allergic reaction to contrast dye   . Dyslipidemia   . Hypertension   . Atrial fibrillation   . Leg cramps   . ACE inhibitor intolerance   . GI bleeding   . Mitral regurgitation   . CAD (coronary artery disease)   . Cardiomyopathy   . Shoulder injury   . Renal insufficiency 09/19/2008    ROS   Patient denies fever, chills, headache, sweats, rash, change in vision, change in hearing, chest pain, cough, nausea or vomiting, urinary symptoms. All other systems are reviewed and are negative.  PHYSICAL EXAM  Patient is here with his family. He is oriented to person time and place. Affect is normal. There is no jugulovenous distention. Lungs are clear. Respiratory effort is nonlabored. Cardiac exam reveals S1 and S2. There premature beats consistent with his EKG. His abdomen is soft. There is no peripheral edema. There no musculoskeletal deformities. There are no skin rashes.  Filed Vitals:   08/07/13 1505  BP: 157/66  Pulse: 53  Height: 5\' 9"  (1.753 m)  Weight: 177 lb (80.287 kg)  SpO2: 98%   EKG is done today and reviewed by me. There is nonspecific interventricular conduction delay. There is no significant QRS change. There is scattered PVCs and PACs.  ASSESSMENT & PLAN

## 2013-08-07 NOTE — Assessment & Plan Note (Signed)
Volume status is stable. No change in therapy. 

## 2013-08-07 NOTE — Patient Instructions (Signed)

## 2013-08-07 NOTE — Assessment & Plan Note (Signed)
He is on appropriate medications. No change in therapy.

## 2013-08-07 NOTE — Assessment & Plan Note (Signed)
His blood pressure at home runs in the range of [id number] systolic. It is slightly higher than this here today. No change in therapy.

## 2013-08-07 NOTE — Assessment & Plan Note (Signed)
There is history of  Mild/moderate mitral regurgitation in 2011. He does not need a followup echo.

## 2013-10-08 ENCOUNTER — Other Ambulatory Visit (INDEPENDENT_AMBULATORY_CARE_PROVIDER_SITE_OTHER): Payer: Self-pay | Admitting: Internal Medicine

## 2013-10-08 ENCOUNTER — Other Ambulatory Visit: Payer: Self-pay | Admitting: Cardiology

## 2013-10-08 DIAGNOSIS — K219 Gastro-esophageal reflux disease without esophagitis: Secondary | ICD-10-CM

## 2013-12-10 ENCOUNTER — Encounter: Payer: Self-pay | Admitting: Cardiology

## 2013-12-10 ENCOUNTER — Ambulatory Visit (INDEPENDENT_AMBULATORY_CARE_PROVIDER_SITE_OTHER): Payer: Medicare Other | Admitting: Cardiology

## 2013-12-10 VITALS — BP 140/74 | HR 51 | Ht 69.0 in | Wt 176.0 lb

## 2013-12-10 DIAGNOSIS — I509 Heart failure, unspecified: Secondary | ICD-10-CM

## 2013-12-10 DIAGNOSIS — I5022 Chronic systolic (congestive) heart failure: Secondary | ICD-10-CM

## 2013-12-10 DIAGNOSIS — I429 Cardiomyopathy, unspecified: Secondary | ICD-10-CM

## 2013-12-10 DIAGNOSIS — I10 Essential (primary) hypertension: Secondary | ICD-10-CM

## 2013-12-10 DIAGNOSIS — I428 Other cardiomyopathies: Secondary | ICD-10-CM

## 2013-12-10 NOTE — Assessment & Plan Note (Signed)
He is inappropriate meds. No change in therapy.

## 2013-12-10 NOTE — Assessment & Plan Note (Signed)
Blood pressure stable. No change in therapy.

## 2013-12-10 NOTE — Progress Notes (Signed)
Patient ID: John Moore, male   DOB: 13-Dec-1927, 80 y.o.   MRN: [id number]    HPI  Patient is seen today to followup his cardiomyopathy. He is actually doing well. I saw him last March, 2015. He's not having any chest pain or signs of heart failure. Blood pressures under good control.    Allergies  Allergen Reactions  . Ace Inhibitors Other (See Comments)    unknown  . Nsaids Other (See Comments)    unknown  . Iodinated Diagnostic Agents Rash    Current Outpatient Prescriptions  Medication Sig Dispense Refill  . amLODipine (NORVASC) 10 MG tablet TAKE 1 TABLET BY MOUTH DAILY  90 tablet  3  . aspirin EC 81 MG tablet Take 81 mg by mouth daily.      . carvedilol (COREG) 25 MG tablet Take 12.5 mg by mouth 2 (two) times daily with a meal.      . DEXILANT 60 MG capsule TAKE 1 CAPSULE EVERY DAY  90 capsule  1  . furosemide (LASIX) 20 MG tablet TAKE 1 TABLET (20 MG TOTAL) BY MOUTH DAILY.  90 tablet  3  . glipiZIDE (GLUCOTROL XL) 5 MG 24 hr tablet Take 1 tablet by mouth 2 (two) times daily.       . metFORMIN (GLUCOPHAGE) 500 MG tablet Take 1,000 mg by mouth 2 (two) times daily with a meal.       . nitroGLYCERIN (NITROSTAT) 0.4 MG SL tablet Place 0.4 mg under the tongue every 5 (five) minutes as needed. For chest pains      . olmesartan (BENICAR) 40 MG tablet Take 1 tablet (40 mg total) by mouth daily.  90 tablet  3  . simvastatin (ZOCOR) 20 MG tablet Take 1 tablet (20 mg total) by mouth at bedtime.  90 tablet  3   No current facility-administered medications for this visit.    History   Social History  . Marital Status: Married    Spouse Name: N/A    Number of Children: N/A  . Years of Education: N/A   Occupational History  . Not on file.   Social History Main Topics  . Smoking status: Former Smoker -- 0.30 packs/day for 30 years    Types: Cigarettes    Quit date: 06/07/1978  . Smokeless tobacco: Never Used     Comment: 40 yrs ago  . Alcohol Use: No  . Drug Use: No  .  Sexual Activity: Not on file   Other Topics Concern  . Not on file   Social History Narrative  . No narrative on file    History reviewed. No pertinent family history.  Past Medical History  Diagnosis Date  . Nipple pain     resolved october 2010  . Leg cramps     night time  . Burping     with hiccups ...question reflux... improved 03/2009  . ACE inhibitor intolerance     ...tolerates ARB  . GI bleeding     ...lower...2002  . Allergic reaction to contrast dye   . Renal insufficiency april 15,2010    ...mild...creatine 1.5.Marland Kitchen  Marland Kitchen Diabetes mellitus     for about 20 yrs.  . Dyslipidemia   . Hypertension   . Atrial fibrillation     atrial fibrillation ..postopCABG....brief treatmentamiodarone...stopped.Marland Kitchen.NSR  . Ejection fraction < 50%     EF 40-45%, echo, December, 2008 / EF 20-25%, echo, August, 2011 /  EF 35-40%, echo, April, 2012  . Mitral regurgitation     .Marland KitchenMarland Kitchen  mild..echo...2008...mild/moderate by echo 01/2010  . CAD (coronary artery disease)     EF 40-45%, December, 2008  /  ejection fraction 20-25%, echo, August, 2011  . CHF (congestive heart failure)     Chronic systolic  . Cardiomyopathy     EF was noted to be worse August, 2011.  No ischemic workup done at that time.  Decision made to treat LV dysfunction and followup and make other decisions later  . Shoulder injury     Patient fell (not syncope) April, 2012  . Prostate disorder   . Thyroid condition   . GERD (gastroesophageal reflux disease)   . Dysphagia   . Type II or unspecified type diabetes mellitus without mention of complication, not stated as uncontrolled   . Diverticulitis   . Hx of CABG     2008    Past Surgical History  Procedure Laterality Date  . Coronary artery bypass graft      december 2008  . Tonsillectomy    . Rotator cuff repair    . Myrinfoplasty    . Colonoscopy  04/2001    Patient Active Problem List   Diagnosis Date Noted  . Chronic systolic CHF (congestive heart failure)  01/24/2013  . Hx of CABG   . Dysphagia 08/17/2011  . Ejection fraction < 50%   . Prostate disorder   . Thyroid condition   . Burping   . Allergic reaction to contrast dye   . Dyslipidemia   . Hypertension   . Atrial fibrillation   . Leg cramps   . ACE inhibitor intolerance   . GI bleeding   . Mitral regurgitation   . CAD (coronary artery disease)   . Cardiomyopathy   . Shoulder injury   . Renal insufficiency 09/19/2008    ROS   Patient denies fever, chills, headache, sweats, rash, change in vision, change in hearing, chest pain, cough, nausea vomiting, urinary symptoms. All other systems are reviewed and are negative.  PHYSICAL EXAM  Patient's oriented to person time and place. Affect is normal. He's here with his daughter. Head is atraumatic. Sclera and conjunctiva are normal. Lungs reveal a few scattered rhonchi. There is no jugulovenous distention. Cardiac exam reveals S1 and S2. Abdomen is soft. There is no peripheral edema.  Filed Vitals:   12/10/13 1302  BP: 140/74  Pulse: 51  Height: 5\' 9"  (1.753 m)  Weight: 176 lb (79.833 kg)     ASSESSMENT & PLAN

## 2013-12-10 NOTE — Assessment & Plan Note (Signed)
Volume status is stable. No change in therapy. 

## 2013-12-10 NOTE — Patient Instructions (Signed)

## 2014-01-02 ENCOUNTER — Other Ambulatory Visit: Payer: Self-pay | Admitting: Cardiology

## 2014-02-05 ENCOUNTER — Other Ambulatory Visit (INDEPENDENT_AMBULATORY_CARE_PROVIDER_SITE_OTHER): Payer: Self-pay | Admitting: Internal Medicine

## 2014-02-05 NOTE — Telephone Encounter (Signed)
Patient will need an office appointment prior to further refills.

## 2014-05-09 ENCOUNTER — Encounter (INDEPENDENT_AMBULATORY_CARE_PROVIDER_SITE_OTHER): Payer: Self-pay | Admitting: Internal Medicine

## 2014-05-09 ENCOUNTER — Ambulatory Visit (INDEPENDENT_AMBULATORY_CARE_PROVIDER_SITE_OTHER): Payer: Medicare Other | Admitting: Internal Medicine

## 2014-05-09 VITALS — BP 116/68 | HR 72 | Temp 97.5°F | Ht 69.0 in | Wt 170.2 lb

## 2014-05-09 DIAGNOSIS — K219 Gastro-esophageal reflux disease without esophagitis: Secondary | ICD-10-CM

## 2014-05-09 MED ORDER — PANTOPRAZOLE SODIUM 40 MG PO TBEC: 40.0000 mg | DELAYED_RELEASE_TABLET | Freq: Every day | ORAL | Status: DC

## 2014-05-09 NOTE — Progress Notes (Signed)
Subjective:    Patient ID: John Moore, male    DOB: [birth year], 80 y.o.   MRN: [id number]  HPI Here today for f/u. He tell me his swallowing is good. No dysphagia. He is eating what he wants. He denies acid reflux which is controlled with Dexilant. Appetite is good. He has lost 12 pounds since his last visit in 2013. BMs are moving normal. He denies any pain.  He says for the most part he is happy.   08/26/2011 EGD/ED:  Indications: Patient is an 80 year old Caucasian male who underwent EGD back in October 2012 have ulcerative esophagitis with a high-grade stricture which was dilated to 16.5 mm with a balloon. Patient was advised to return for office visit but he did not. He felt so much better he decided to cut his medication. However now he has developed intermittent solid food dysphagia. He is therefore undergoing repeat  Impression: Healed esophagitis. Stricture at GE junction and dilated with a balloon to 18 mm. Small sliding hiatal hernia.  04/01/2011 EGD/ED; Dysphagia  Impression: Ulcerated reflux esophagitis with high grade stricture at gastroesophageal junction. This stricture was dilated with a balloon to 16.5 mm. 3 cm size sliding hiatal.  Review of Systems Past Medical History  Diagnosis Date  . Nipple pain     resolved october 2010  . Leg cramps     night time  . Burping     with hiccups ...question reflux... improved 03/2009  . ACE inhibitor intolerance     ...tolerates ARB  . GI bleeding     ...lower...2002  . Allergic reaction to contrast dye   . Renal insufficiency april 15,2010    ...mild...creatine 1.5.Marland Kitchen  Marland Kitchen Diabetes mellitus     for about 20 yrs.  . Dyslipidemia   . Hypertension   . Atrial fibrillation     atrial fibrillation ..postopCABG....brief treatmentamiodarone...stopped.Marland Kitchen.NSR  . Ejection fraction < 50%     EF 40-45%, echo, December, 2008 / EF 20-25%, echo, August, 2011 /  EF 35-40%, echo, April, 2012  . Mitral regurgitation    ...mild..echo...2008...mild/moderate by echo 01/2010  . CAD (coronary artery disease)     EF 40-45%, December, 2008  /  ejection fraction 20-25%, echo, August, 2011  . CHF (congestive heart failure)     Chronic systolic  . Cardiomyopathy     EF was noted to be worse August, 2011.  No ischemic workup done at that time.  Decision made to treat LV dysfunction and followup and make other decisions later  . Shoulder injury     Patient fell (not syncope) April, 2012  . Prostate disorder   . Thyroid condition   . GERD (gastroesophageal reflux disease)   . Dysphagia   . Type II or unspecified type diabetes mellitus without mention of complication, not stated as uncontrolled   . Diverticulitis   . Hx of CABG     2008    Past Surgical History  Procedure Laterality Date  . Coronary artery bypass graft      december 2008  . Tonsillectomy    . Rotator cuff repair    . Myrinfoplasty    . Colonoscopy  04/2001    Allergies  Allergen Reactions  . Ace Inhibitors Other (See Comments)    unknown  . Nsaids Other (See Comments)    unknown  . Iodinated Diagnostic Agents Rash    Current Outpatient Prescriptions on File Prior to Visit  Medication Sig Dispense Refill  . amLODipine (NORVASC) 10  MG tablet TAKE 1 TABLET BY MOUTH DAILY 90 tablet 3  . aspirin EC 81 MG tablet Take 81 mg by mouth daily.    . carvedilol (COREG) 25 MG tablet TAKE 1 TABLET (25 MG TOTAL) BY MOUTH 2 (TWO) TIMES DAILY WITH A MEAL. (Patient taking differently: 1/2 tab BID) 180 tablet 3  . DEXILANT 60 MG capsule TAKE 1 CAPSULE BY MOUTH EVERY DAY 90 capsule 2  . furosemide (LASIX) 20 MG tablet TAKE 1 TABLET (20 MG TOTAL) BY MOUTH DAILY. 90 tablet 3  . glipiZIDE (GLUCOTROL XL) 5 MG 24 hr tablet Take 1 tablet by mouth 2 (two) times daily.     . metFORMIN (GLUCOPHAGE) 500 MG tablet Take 1,000 mg by mouth 2 (two) times daily with a meal. Two in am and one in pm    . nitroGLYCERIN (NITROSTAT) 0.4 MG SL tablet Place 0.4 mg under the  tongue every 5 (five) minutes as needed. For chest pains    . olmesartan (BENICAR) 40 MG tablet Take 1 tablet (40 mg total) by mouth daily. 90 tablet 3  . simvastatin (ZOCOR) 20 MG tablet Take 1 tablet (20 mg total) by mouth at bedtime. 90 tablet 3   No current facility-administered medications on file prior to visit.        Objective:   Physical Exam  Filed Vitals:   05/09/14 1355  Height: 5\' 9"  (1.753 m)  Weight: 170 lb 3.2 oz (77.202 kg)   Alert and oriented. Skin warm and dry. Oral mucosa is moist.   . Sclera anicteric, conjunctivae is pink. Thyroid not enlarged. No cervical lymphadenopathy. Lungs clear. Heart regular rate and rhythm.  Abdomen is soft. Bowel sounds are positive. No hepatomegaly. No abdominal masses felt. No tenderness.  No edema to lower extremities.        Assessment & Plan:  Solid food. Dysphagia. Doing well. No dysphagia at this time. OV in 1 year. Continue present medication. Will switch Dexilant to Protonix. He is in the [place].

## 2014-05-09 NOTE — Patient Instructions (Signed)
OV in one year. Rx for Protonix 40mg .

## 2014-06-12 ENCOUNTER — Ambulatory Visit (INDEPENDENT_AMBULATORY_CARE_PROVIDER_SITE_OTHER): Payer: Medicare Other | Admitting: Cardiology

## 2014-06-12 ENCOUNTER — Encounter: Payer: Self-pay | Admitting: Cardiology

## 2014-06-12 VITALS — BP 100/66 | HR 64 | Ht 67.0 in | Wt 174.0 lb

## 2014-06-12 DIAGNOSIS — I429 Cardiomyopathy, unspecified: Secondary | ICD-10-CM

## 2014-06-12 DIAGNOSIS — I25810 Atherosclerosis of coronary artery bypass graft(s) without angina pectoris: Secondary | ICD-10-CM

## 2014-06-12 DIAGNOSIS — I5022 Chronic systolic (congestive) heart failure: Secondary | ICD-10-CM

## 2014-06-12 NOTE — Patient Instructions (Signed)

## 2014-06-12 NOTE — Assessment & Plan Note (Signed)
Volume status is stable. No change in therapy. 

## 2014-06-12 NOTE — Assessment & Plan Note (Signed)
Coronary disease is stable.  No further workup. 

## 2014-06-12 NOTE — Progress Notes (Signed)
Patient ID: John Moore, male   DOB: 15-Aug-1927, 81 y.o.   MRN: [id number]    HPI Patient is seen to follow-up cardiomyopathy. He is actually done very well. His ejection fraction has varied over time. Most recently the EF was in the 35-40% range. He tolerates his medications. He is not having chest pain or shortness of breath.  Allergies  Allergen Reactions  . Ace Inhibitors Other (See Comments)    unknown  . Nsaids Other (See Comments)    unknown  . Iodinated Diagnostic Agents Rash    Current Outpatient Prescriptions  Medication Sig Dispense Refill  . amLODipine (NORVASC) 10 MG tablet TAKE 1 TABLET BY MOUTH DAILY 90 tablet 3  . aspirin EC 81 MG tablet Take 81 mg by mouth daily.    . carvedilol (COREG) 25 MG tablet TAKE 1 TABLET (25 MG TOTAL) BY MOUTH 2 (TWO) TIMES DAILY WITH A MEAL. (Patient taking differently: 1/2 tab BID) 180 tablet 3  . DEXILANT 60 MG capsule TAKE 1 CAPSULE BY MOUTH EVERY DAY 90 capsule 2  . furosemide (LASIX) 20 MG tablet TAKE 1 TABLET (20 MG TOTAL) BY MOUTH DAILY. 90 tablet 3  . glipiZIDE (GLUCOTROL XL) 5 MG 24 hr tablet Take 1 tablet by mouth 2 (two) times daily.     . metFORMIN (GLUCOPHAGE) 500 MG tablet Take 1,000 mg by mouth 2 (two) times daily with a meal. Two in am and one in pm    . nitroGLYCERIN (NITROSTAT) 0.4 MG SL tablet Place 0.4 mg under the tongue every 5 (five) minutes as needed. For chest pains    . olmesartan (BENICAR) 40 MG tablet Take 1 tablet (40 mg total) by mouth daily. 90 tablet 3  . pantoprazole (PROTONIX) 40 MG tablet Take 1 tablet (40 mg total) by mouth daily. 90 tablet 4  . simvastatin (ZOCOR) 20 MG tablet Take 1 tablet (20 mg total) by mouth at bedtime. 90 tablet 3   No current facility-administered medications for this visit.    History   Social History  . Marital Status: Married    Spouse Name: N/A    Number of Children: N/A  . Years of Education: N/A   Occupational History  . Not on file.   Social History Main Topics   . Smoking status: Former Smoker -- 0.30 packs/day for 30 years    Types: Cigarettes    Quit date: 06/07/1978  . Smokeless tobacco: Never Used     Comment: 40 yrs ago  . Alcohol Use: No  . Drug Use: No  . Sexual Activity: Not on file   Other Topics Concern  . Not on file   Social History Narrative    History reviewed. No pertinent family history.  Past Medical History  Diagnosis Date  . Nipple pain     resolved october 2010  . Leg cramps     night time  . Burping     with hiccups ...question reflux... improved 03/2009  . ACE inhibitor intolerance     ...tolerates ARB  . GI bleeding     ...lower...2002  . Allergic reaction to contrast dye   . Renal insufficiency april 15,2010    ...mild...creatine 1.5.Marland Kitchen  Marland Kitchen Diabetes mellitus     for about 20 yrs.  . Dyslipidemia   . Hypertension   . Atrial fibrillation     atrial fibrillation ..postopCABG....brief treatmentamiodarone...stopped.Marland Kitchen.NSR  . Ejection fraction < 50%     EF 40-45%, echo, December, 2008 / EF 20-25%,  echo, August, 2011 /  EF 35-40%, echo, April, 2012  . Mitral regurgitation     ...mild..echo...2008...mild/moderate by echo 01/2010  . CAD (coronary artery disease)     EF 40-45%, December, 2008  /  ejection fraction 20-25%, echo, August, 2011  . CHF (congestive heart failure)     Chronic systolic  . Cardiomyopathy     EF was noted to be worse August, 2011.  No ischemic workup done at that time.  Decision made to treat LV dysfunction and followup and make other decisions later  . Shoulder injury     Patient fell (not syncope) April, 2012  . Prostate disorder   . Thyroid condition   . GERD (gastroesophageal reflux disease)   . Dysphagia   . Type II or unspecified type diabetes mellitus without mention of complication, not stated as uncontrolled   . Diverticulitis   . Hx of CABG     2008    Past Surgical History  Procedure Laterality Date  . Coronary artery bypass graft      december 2008  .  Tonsillectomy    . Rotator cuff repair    . Myrinfoplasty    . Colonoscopy  04/2001    Patient Active Problem List   Diagnosis Date Noted  . Chronic systolic CHF (congestive heart failure) 01/24/2013  . Hx of CABG   . Dysphagia 08/17/2011  . Ejection fraction < 50%   . Prostate disorder   . Thyroid condition   . Burping   . Allergic reaction to contrast dye   . Dyslipidemia   . Hypertension   . Atrial fibrillation   . Leg cramps   . ACE inhibitor intolerance   . GI bleeding   . Mitral regurgitation   . CAD (coronary artery disease)   . Cardiomyopathy   . Shoulder injury   . Renal insufficiency 09/19/2008    ROS  Patient denies fever, chills, headache, sweats, rash, change in vision, change in hearing, chest pain, cough, nausea or vomiting, urinary symptoms. All other systems are reviewed and are negative.  PHYSICAL EXAM Patient is oriented to person time and place. Affect is normal. He is here with his daughter. Head is atraumatic. Sclera and conjunctiva are normal. Lungs are clear. Respiratory effort is not labored. There is no jugular venous distention. Cardiac exam reveals an S1 and S2. He has some premature beats. The abdomen is soft. There is no peripheral edema.  Filed Vitals:   06/12/14 1314  BP: 100/66  Pulse: 64  Height: 5\' 7"  (1.702 m)  Weight: 174 lb (78.926 kg)     ASSESSMENT & PLAN

## 2014-06-12 NOTE — Assessment & Plan Note (Signed)
Patient is on appropriate medications. No change in therapy.

## 2014-06-25 ENCOUNTER — Encounter (INDEPENDENT_AMBULATORY_CARE_PROVIDER_SITE_OTHER): Payer: Self-pay | Admitting: *Deleted

## 2014-06-25 ENCOUNTER — Ambulatory Visit (INDEPENDENT_AMBULATORY_CARE_PROVIDER_SITE_OTHER): Payer: Medicare Other | Admitting: Internal Medicine

## 2014-06-25 ENCOUNTER — Other Ambulatory Visit (INDEPENDENT_AMBULATORY_CARE_PROVIDER_SITE_OTHER): Payer: Self-pay | Admitting: *Deleted

## 2014-06-25 ENCOUNTER — Encounter (INDEPENDENT_AMBULATORY_CARE_PROVIDER_SITE_OTHER): Payer: Self-pay | Admitting: Internal Medicine

## 2014-06-25 VITALS — BP 92/50 | HR 56 | Temp 97.6°F | Ht 68.0 in | Wt 171.3 lb

## 2014-06-25 DIAGNOSIS — I25810 Atherosclerosis of coronary artery bypass graft(s) without angina pectoris: Secondary | ICD-10-CM

## 2014-06-25 DIAGNOSIS — R1310 Dysphagia, unspecified: Secondary | ICD-10-CM

## 2014-06-25 DIAGNOSIS — R1314 Dysphagia, pharyngoesophageal phase: Secondary | ICD-10-CM

## 2014-06-25 NOTE — Patient Instructions (Signed)
EGD/ED. The risks and benefits such as perforation, bleeding, and infection were reviewed with the patient and is agreeable.The risks and benefits such as perforation, bleeding, and infection were reviewed with the patient and is agreeable.

## 2014-06-25 NOTE — Progress Notes (Signed)
Subjective:    Patient ID: John Moore, male    DOB: 1928-03-23, 81 y.o.   MRN: [id number]  HPI Patient is here with c/o dysphagia. He was last seen in December of 2015 with no c/o dysphagia. Apparently he was at his daughter's house this past Sunday, and food lodged in his esophagus. Roast beef lodged in his esophagus. He says water would not even go down. The bolus finally went down after 2 hours. Hx of dysphagia. His last EGD was in 2013. Appetite is good. No abdominal pain. BMs are normal.  No melena or BRRB. Takes Dexilant on a daily basis.      Procedure Date: 08/26/2011 Procedure: EGD with ED. Indications: Patient is an 81 year old Caucasian male who underwent EGD back in October 2012 have ulcerative esophagitis with a high-grade stricture which was dilated to 16.5 mm with a balloon. Patient was advised to return for office visit but he did not. He felt so much better he decided to cut his medication. However now he has developed intermittent solid food dysphagia. He is therefore undergoing repeat dilation. Impression: Healed esophagitis. Stricture at GE junction and dilated with a balloon to 18 mm. Small sliding hiatal hernia.   04/01/2011 EGD/ED; Dysphagia Impression: Ulcerated reflux esophagitis with high grade stricture at gastroesophageal junction. This stricture was dilated with a balloon to 16.5 mm. 3 cm size sliding hiatal.  Review of Systems  Widowed. One daughter in good health.  Past Medical History  Diagnosis Date  . Nipple pain     resolved october 2010  . Leg cramps     night time  . Burping     with hiccups ...question reflux... improved 03/2009  . ACE inhibitor intolerance     ...tolerates ARB  . GI bleeding     ...lower...2002  . Allergic reaction to contrast dye   . Renal insufficiency april 15,2010    ...mild...creatine  1.5..  . Diabetes mellitus     for about 20 yrs.  . Dyslipidemia   . Hypertension   . Atrial fibrillation     atrial fibrillation ..postopCABG....brief treatmentamiodarone...stopped...NSR  . Ejection fraction < 50%     EF 40-45%, echo, December, 2008 / EF 20-25%, echo, August, 2011 /  EF 35-40%, echo, April, 2012  . Mitral regurgitation     ...mild..echo...2008...mild/moderate by echo 01/2010  . CAD (coronary artery disease)     EF 40-45%, December, 2008  /  ejection fraction 20-25%, echo, August, 2011  . CHF (congestive heart failure)     Chronic systolic  . Cardiomyopathy     EF was noted to be worse August, 2011.  No ischemic workup done at that time.  Decision made to treat LV dysfunction and followup and make other decisions later  . Shoulder injury     Patient fell (not syncope) April, 2012  . Prostate disorder   . Thyroid condition   . GERD (gastroesophageal reflux disease)   . Dysphagia   . Type II or unspecified type diabetes mellitus without mention of complication, not stated as uncontrolled   . Diverticulitis   . Hx of CABG     20 08    Past Surgical History  Procedure Laterality Date  . Coronary artery bypass graft      december 2008  . Tonsillectomy    . Rotator cuff repair    . Myrinfoplasty    . Colonoscopy  04/2001    Allergies  Allergen Reactions  . Ace Inhibitors Other (See  Comments)    unknown  . Nsaids Other (See Comments)    unknown  . Iodinated Diagnostic Agents Rash    Current Outpatient Prescriptions on File Prior to Visit  Medication Sig Dispense Refill  . amLODipine (NORVASC) 10 MG tablet TAKE 1 TABLET BY MOUTH DAILY 90 tablet 3  . aspirin EC 81 MG tablet Take 81 mg by mouth daily.    . carvedilol (COREG) 25 MG tablet TAKE 1 TABLET (25 MG TOTAL) BY MOUTH 2 (TWO) TIMES DAILY WITH A MEAL. (Patient taking differently: 1/2 tab BID) 180 tablet 3  . DEXILANT 60 MG capsule TAKE 1 CAPSULE BY MOUTH EVERY DAY 90 capsule 2  . furosemide (LASIX) 20  MG tablet TAKE 1 TABLET (20 MG TOTAL) BY MOUTH DAILY. 90 tablet 3  . glipiZIDE (GLUCOTROL XL) 5 MG 24 hr tablet Take 1 tablet by mouth 2 (two) times daily.     . metFORMIN (GLUCOPHAGE) 500 MG tablet Take 1,000 mg by mouth 2 (two) times daily with a meal. Two in am and one in pm    . nitroGLYCERIN (NITROSTAT) 0.4 MG SL tablet Place 0.4 mg under the tongue every 5 (five) minutes as needed. For chest pains    . olmesartan (BENICAR) 40 MG tablet Take 1 tablet (40 mg total) by mouth daily. 90 tablet 3  . pantoprazole (PROTONIX) 40 MG tablet Take 1 tablet (40 mg total) by mouth daily. 90 tablet 4  . simvastatin (ZOCOR) 20 MG tablet Take 1 tablet (20 mg total) by mouth at bedtime. 90 tablet 3   No current facility-administered medications on file prior to visit.        Objective:   Physical Exam  Filed Vitals:   06/25/14 1356  Height: 5\' 8"  (1.727 m)  Weight: 171 lb 4.8 oz (77.701 kg)  Alert and oriented. Skin warm and dry. Oral mucosa is moist.   . Sclera anicteric, conjunctivae is pink. Thyroid not enlarged. No cervical lymphadenopathy. Lungs clear. Heart regular rate and rhythm.  Abdomen is soft. Bowel sounds are positive. No hepatomegaly. No abdominal masses felt. No tenderness.  No edema to lower extremities.           Assessment & Plan:  Solid food dysphagia. Stricture needs to be ruled out.  EGD/ED. The risks and benefits such as perforation, bleeding, and infection were reviewed with the patient and is agreeable.

## 2014-06-27 ENCOUNTER — Encounter (HOSPITAL_COMMUNITY)
Admission: RE | Disposition: A | Discharge status: Home / Self Care | Payer: Self-pay | Source: Ambulatory Visit | Attending: Internal Medicine

## 2014-06-27 ENCOUNTER — Telehealth: Payer: Self-pay | Admitting: Cardiology

## 2014-06-27 ENCOUNTER — Other Ambulatory Visit: Payer: Self-pay | Admitting: *Deleted

## 2014-06-27 ENCOUNTER — Ambulatory Visit (HOSPITAL_COMMUNITY)
Admission: RE | Admit: 2014-06-27 | Discharge: 2014-06-27 | Disposition: A | Discharge status: Home / Self Care | Payer: Medicare Other | Source: Ambulatory Visit | Attending: Internal Medicine | Admitting: Internal Medicine

## 2014-06-27 ENCOUNTER — Encounter (HOSPITAL_COMMUNITY): Payer: Self-pay | Admitting: *Deleted

## 2014-06-27 ENCOUNTER — Ambulatory Visit (INDEPENDENT_AMBULATORY_CARE_PROVIDER_SITE_OTHER): Payer: Medicare Other | Admitting: *Deleted

## 2014-06-27 DIAGNOSIS — Z7982 Long term (current) use of aspirin: Secondary | ICD-10-CM | POA: Diagnosis not present

## 2014-06-27 DIAGNOSIS — I429 Cardiomyopathy, unspecified: Secondary | ICD-10-CM | POA: Insufficient documentation

## 2014-06-27 DIAGNOSIS — K449 Diaphragmatic hernia without obstruction or gangrene: Secondary | ICD-10-CM | POA: Diagnosis not present

## 2014-06-27 DIAGNOSIS — K222 Esophageal obstruction: Secondary | ICD-10-CM

## 2014-06-27 DIAGNOSIS — I2510 Atherosclerotic heart disease of native coronary artery without angina pectoris: Secondary | ICD-10-CM | POA: Insufficient documentation

## 2014-06-27 DIAGNOSIS — I4891 Unspecified atrial fibrillation: Secondary | ICD-10-CM | POA: Insufficient documentation

## 2014-06-27 DIAGNOSIS — I10 Essential (primary) hypertension: Secondary | ICD-10-CM | POA: Diagnosis not present

## 2014-06-27 DIAGNOSIS — E785 Hyperlipidemia, unspecified: Secondary | ICD-10-CM | POA: Diagnosis not present

## 2014-06-27 DIAGNOSIS — Z87891 Personal history of nicotine dependence: Secondary | ICD-10-CM | POA: Diagnosis not present

## 2014-06-27 DIAGNOSIS — I5022 Chronic systolic (congestive) heart failure: Secondary | ICD-10-CM | POA: Diagnosis not present

## 2014-06-27 DIAGNOSIS — E119 Type 2 diabetes mellitus without complications: Secondary | ICD-10-CM | POA: Insufficient documentation

## 2014-06-27 DIAGNOSIS — K219 Gastro-esophageal reflux disease without esophagitis: Secondary | ICD-10-CM | POA: Diagnosis not present

## 2014-06-27 DIAGNOSIS — R1310 Dysphagia, unspecified: Secondary | ICD-10-CM | POA: Diagnosis present

## 2014-06-27 DIAGNOSIS — Z951 Presence of aortocoronary bypass graft: Secondary | ICD-10-CM | POA: Insufficient documentation

## 2014-06-27 HISTORY — PX: ESOPHAGOGASTRODUODENOSCOPY: SHX5428

## 2014-06-27 HISTORY — PX: MALONEY DILATION: SHX5535

## 2014-06-27 LAB — GLUCOSE, CAPILLARY: Glucose-Capillary: 152 mg/dL — ABNORMAL HIGH (ref 70–99)

## 2014-06-27 SURGERY — EGD (ESOPHAGOGASTRODUODENOSCOPY)
Anesthesia: Moderate Sedation

## 2014-06-27 MED ORDER — MIDAZOLAM HCL 5 MG/5ML IJ SOLN
INTRAMUSCULAR | Status: AC
  Filled 2014-06-27: qty 10

## 2014-06-27 MED ORDER — BUTAMBEN-TETRACAINE-BENZOCAINE 2-2-14 % EX AERO
INHALATION_SPRAY | CUTANEOUS | Status: DC | PRN
  Administered 2014-06-27: 2 via TOPICAL

## 2014-06-27 MED ORDER — MEPERIDINE HCL 50 MG/ML IJ SOLN
INTRAMUSCULAR | Status: DC | PRN
  Administered 2014-06-27: 15 mg via INTRAVENOUS

## 2014-06-27 MED ORDER — SODIUM CHLORIDE 0.9 % IV SOLN
INTRAVENOUS | Status: DC
  Administered 2014-06-27: 1000 mL via INTRAVENOUS

## 2014-06-27 MED ORDER — SIMETHICONE 40 MG/0.6ML PO SUSP
ORAL | Status: DC | PRN
  Administered 2014-06-27: 08:00:00

## 2014-06-27 MED ORDER — MEPERIDINE HCL 50 MG/ML IJ SOLN
INTRAMUSCULAR | Status: AC
  Filled 2014-06-27: qty 1

## 2014-06-27 MED ORDER — MIDAZOLAM HCL 5 MG/5ML IJ SOLN
INTRAMUSCULAR | Status: DC | PRN
  Administered 2014-06-27 (×4): 1 mg via INTRAVENOUS

## 2014-06-27 NOTE — Telephone Encounter (Signed)
Dr. Inocente Salles McDowell's help appreciated

## 2014-06-27 NOTE — Progress Notes (Signed)
Pt will follow up with cardiology today and possibly be placed on a cardiac monitor.

## 2014-06-27 NOTE — H&P (Signed)
John Moore is an 81 y.o. male.   Chief Complaint: Patient is here for EGD and ED. HPI: Patient is 81 year old Caucasian male with multiple medical problems including chronic GERD complicated by esophageal stricture. Last exam was in March 2013 went distal esophageal stricture was dilated to 18 mm with a balloon. He has done well until recently he's been having intermittent solid food dysphagia. He had an episode of food impaction few days ago. He was able to get relieved spontaneously. Heartburn is well controlled with PPI. He has good appetite and denies weight loss. He also denies chest pain or dyspnea.  Past Medical History  Diagnosis Date  . Nipple pain     resolved october 2010  . Leg cramps     night time  . Burping     with hiccups ...question reflux... improved 03/2009  . ACE inhibitor intolerance     ...tolerates ARB  . GI bleeding     ...lower...2002  . Allergic reaction to contrast dye   . Renal insufficiency april 15,2010    ...mild...creatine 1.5.Marland Kitchen  Marland Kitchen Diabetes mellitus     for about 20 yrs.  . Dyslipidemia   . Hypertension   . Atrial fibrillation     atrial fibrillation ..postopCABG....brief treatmentamiodarone...stopped.Marland Kitchen.NSR  . Ejection fraction < 50%     EF 40-45%, echo, December, 2008 / EF 20-25%, echo, August, 2011 /  EF 35-40%, echo, April, 2012  . Mitral regurgitation     ...mild..echo...2008...mild/moderate by echo 01/2010  . CAD (coronary artery disease)     EF 40-45%, December, 2008  /  ejection fraction 20-25%, echo, August, 2011  . CHF (congestive heart failure)     Chronic systolic  . Cardiomyopathy     EF was noted to be worse August, 2011.  No ischemic workup done at that time.  Decision made to treat LV dysfunction and followup and make other decisions later  . Shoulder injury     Patient fell (not syncope) April, 2012  . Prostate disorder   . Thyroid condition   . GERD (gastroesophageal reflux disease)   . Dysphagia   . Type II or unspecified  type diabetes mellitus without mention of complication, not stated as uncontrolled   . Diverticulitis   . Hx of CABG     2008    Past Surgical History  Procedure Laterality Date  . Coronary artery bypass graft      december 2008  . Tonsillectomy    . Rotator cuff repair    . Myrinfoplasty    . Colonoscopy  04/2001    History reviewed. No pertinent family history. Social History:  reports that he quit smoking about 36 years ago. His smoking use included Cigarettes. He has a 9 pack-year smoking history. He has never used smokeless tobacco. He reports that he does not drink alcohol or use illicit drugs.  Allergies:  Allergies  Allergen Reactions  . Ace Inhibitors Other (See Comments)    unknown  . Nsaids Other (See Comments)    unknown  . Iodinated Diagnostic Agents Rash    Medications Prior to Admission  Medication Sig Dispense Refill  . amLODipine (NORVASC) 10 MG tablet TAKE 1 TABLET BY MOUTH DAILY 90 tablet 3  . aspirin EC 81 MG tablet Take 81 mg by mouth daily.    . carvedilol (COREG) 25 MG tablet TAKE 1 TABLET (25 MG TOTAL) BY MOUTH 2 (TWO) TIMES DAILY WITH A MEAL. (Patient taking differently: 1/2 tab BID) 180 tablet  3  . DEXILANT 60 MG capsule TAKE 1 CAPSULE BY MOUTH EVERY DAY 90 capsule 2  . furosemide (LASIX) 20 MG tablet TAKE 1 TABLET (20 MG TOTAL) BY MOUTH DAILY. 90 tablet 3  . glipiZIDE (GLUCOTROL XL) 5 MG 24 hr tablet Take 1 tablet by mouth 2 (two) times daily.     . metFORMIN (GLUCOPHAGE) 500 MG tablet Take 1,000 mg by mouth 2 (two) times daily with a meal. Two in am and one in pm    . olmesartan (BENICAR) 40 MG tablet Take 1 tablet (40 mg total) by mouth daily. 90 tablet 3  . pantoprazole (PROTONIX) 40 MG tablet Take 1 tablet (40 mg total) by mouth daily. 90 tablet 4  . simvastatin (ZOCOR) 20 MG tablet Take 1 tablet (20 mg total) by mouth at bedtime. 90 tablet 3  . nitroGLYCERIN (NITROSTAT) 0.4 MG SL tablet Place 0.4 mg under the tongue every 5 (five) minutes as  needed. For chest pains      Results for orders placed or performed during the hospital encounter of 06/27/14 (from the past 48 hour(s))  Glucose, capillary     Status: Abnormal   Collection Time: 06/27/14  7:38 AM  Result Value Ref Range   Glucose-Capillary 152 (H) 70 - 99 mg/dL   No results found.  ROS  Blood pressure 172/58, pulse 61, temperature 97.9 F (36.6 C), temperature source Oral, resp. rate 21, height 5\' 8"  (1.727 m), weight 171 lb (77.565 kg), SpO2 98 %. Physical Exam  Constitutional: He appears well-developed and well-nourished.  HENT:  Mouth/Throat: Oropharynx is clear and moist.  Eyes: Conjunctivae are normal. No scleral icterus.  Neck: No thyromegaly present.  Cardiovascular:  Irregular rhythm normal S1 and S2. No murmur or gallop noted.  Respiratory: Effort normal and breath sounds normal.  GI: Soft. He exhibits no distension and no mass. There is no tenderness.  Musculoskeletal: He exhibits no edema.  Lymphadenopathy:    He has no cervical adenopathy.  Neurological: He is alert.  Skin: Skin is warm and dry.     Monitoring reveals bigeminy and intermittent 3 runs of PVCs.    Assessment/Plan Solid food dysphagia. Chronic GERD complicated by distal esophageal stricture. EGD with ED.   REHMAN,NAJEEB U 06/27/2014, 8:33 AM

## 2014-06-27 NOTE — Discharge Instructions (Addendum)
Resume usual medications and diet. Do not take Pantoprazole while on Dexilant. No driving for 24 hours. Patient will follow up with cardiologist today.  Esophagogastroduodenoscopy Care After Refer to this sheet in the next few weeks. These instructions provide you with information on caring for yourself after your procedure. Your caregiver may also give you more specific instructions. Your treatment has been planned according to current medical practices, but problems sometimes occur. Call your caregiver if you have any problems or questions after your procedure.  HOME CARE INSTRUCTIONS  Do not eat or drink anything until the numbing medicine (local anesthetic) has worn off and your gag reflex has returned. You will know that the local anesthetic has worn off when you can swallow comfortably.  Do not drive for 12 hours after the procedure or as directed by your caregiver.  Only take medicines as directed by your caregiver. SEEK MEDICAL CARE IF:   You cannot stop coughing.  You are not urinating at all or less than usual. SEEK IMMEDIATE MEDICAL CARE IF:  You have difficulty swallowing.  You cannot eat or drink.  You have worsening throat or chest pain.  You have dizziness, lightheadedness, or you faint.  You have nausea or vomiting.  You have chills.  You have a fever.  You have severe abdominal pain.  You have black, tarry, or bloody stools. Document Released: 05/10/2012 Document Reviewed: 05/10/2012 [facility] Patient Information 2015 [place]. This information is not intended to replace advice given to you by your health care provider. Make sure you discuss any questions you have with your health care provider.

## 2014-06-27 NOTE — Patient Instructions (Signed)
Your physician recommends that you schedule a follow-up appointment already scheduled to see Dr. Ron Parker 07/05/14.

## 2014-06-27 NOTE — Op Note (Signed)
EGD PROCEDURE REPORT  PATIENT:  John Moore  MR#:  [id number] Birthdate:  04/12/1928, 81 y.o., male Endoscopist:  Dr. Rogene Houston, MD Referred By:  Dr. Glenda Chroman, MD , M.D.  Procedure Date: 06/27/2014  Procedure:   EGD with ED  Indications:  Patient is 81 year old Caucasian male was chronic GERD complicated by distal esophageal stricture was dilated in March 2013 and he now presents with intermittent solid food dysphagia. Heartburn is well controlled with therapy.            Informed Consent:  The risks, benefits, alternatives & imponderables which include, but are not limited to, bleeding, infection, perforation, drug reaction and potential missed lesion have been reviewed.  The potential for biopsy, lesion removal, esophageal dilation, etc. have also been discussed.  Questions have been answered.  All parties agreeable.  Please see history & physical in medical record for more information.  Medications:  Demerol 15 mg IV Versed 4 mg IV Cetacaine spray topically for oropharyngeal anesthesia  Description of procedure:  The endoscope was introduced through the mouth and advanced to the second portion of the duodenum without difficulty or limitations. The mucosal surfaces were surveyed very carefully during advancement of the scope and upon withdrawal.  Findings:  Esophagus:  Mucosa of the esophagus was normal. Ringlike stricture noted at GE junction. No difficulty encountered in passing the scope across it. GEJ:  36 cm Hiatus:  39 cm Stomach:  Stomach was empty and distended very well with insufflation. Folds in the proximal stomach were normal. Examination of mucosa at gastric body, antrum, pyloric channel, angularis fundus and cardia was normal. Duodenum:  Normal bulbar and post bulbar mucosa.  Therapeutic/Diagnostic Maneuvers Performed:   Esophagus was dilated by passing 56 [place] Maloney dilator to full insertion. As the dilator was withdrawn endoscope was passed again and  mucosal disruption noted at GE junction.  Complications:  None  Impression: Ringlike stricture at GE junction dilated by passing 75 [place] Maloney dilator. Small sliding hiatal hernia.  Recommendations:  Standard instructions given. Please note patient is on Dexilant and not Pantoprazole(pantoprazole taken off the list of his medications). Will talk with Dr. Ron Parker regarding patient's arrhythmia.  Jacquel Redditt U  06/27/2014  9:03 AM  CC: Dr. Glenda Chroman., MD & Dr. Rayne Du ref. provider found

## 2014-06-27 NOTE — Telephone Encounter (Signed)
Received telephone call from Dr. Laural Golden concerning John Moore. He underwent outpatient endoscopy today with esophageal dilatation. On telemetry he was noted to have frequent ventricular ectopy including some bigeminy and also 3 beat runs. These were asymptomatic in terms of chest pain and palpitations by report. Outpatient regimen includes beta blocker in the form of Coreg 25 mg twice daily. He has known ischemic heart disease with cardiomyopathy, LVEF previously documented in the 40-45% range. Plan is for patient to go home today from the endoscopy suite. I left a message with the [facility] office to arrange to have John Moore wear a 24-hour Holter monitor to better quantify frequency of PVCs. He will have a follow-up visit scheduled with Dr. Ron Parker to review.  Satira Sark, M.D., F.A.C.C.

## 2014-06-27 NOTE — Progress Notes (Signed)
Per Dr. Domenic Polite pt and daughter came in for 24 hr holter. Placed monitor on pt, explained instructions, both pt and daughter understood. Expected to bring back depending on weather 07/01/2014-07/02/2013. Pt is schedule to see Dr. Ron Parker 07/05/14. Pt had no complaints

## 2014-06-28 ENCOUNTER — Encounter (HOSPITAL_COMMUNITY): Payer: Self-pay | Admitting: Internal Medicine

## 2014-07-05 ENCOUNTER — Ambulatory Visit (INDEPENDENT_AMBULATORY_CARE_PROVIDER_SITE_OTHER): Payer: Medicare Other | Admitting: Cardiology

## 2014-07-05 ENCOUNTER — Encounter: Payer: Self-pay | Admitting: Cardiology

## 2014-07-05 VITALS — BP 167/69 | HR 67 | Ht 68.0 in | Wt 172.8 lb

## 2014-07-05 DIAGNOSIS — I493 Ventricular premature depolarization: Secondary | ICD-10-CM | POA: Insufficient documentation

## 2014-07-05 DIAGNOSIS — I429 Cardiomyopathy, unspecified: Secondary | ICD-10-CM

## 2014-07-05 DIAGNOSIS — I25810 Atherosclerosis of coronary artery bypass graft(s) without angina pectoris: Secondary | ICD-10-CM

## 2014-07-05 NOTE — Assessment & Plan Note (Signed)
Patient has a known cardiomyopathy. His EF was noted to be worse in August, 2011. He did not undergo ischemic workup at that time. He feels well at this time.

## 2014-07-05 NOTE — Assessment & Plan Note (Signed)
His ventricular ectopy was noted during the time of an esophageal stricture dilatation. He has no symptoms. His 24-hour Holter reveals large number of PVCs greater than 30,000. There was one 7 beat run of ventricular tachycardia. At this point we do not know if his PVCs could be playing a role in his cardiomyopathy. He is not a candidate for an ICD historically. Chemistry and magnesium will be checked today. Any abnormalities will be treated aggressively. I will then see the patient back for follow-up. He did receive amiodarone in the past for some atrial fibrillation and the drug was eventually stopped. I've considered whether we should treat his current ventricular ectopy. We will address this further when I see him in follow-up.  As part of today's evaluation I spent greater than 25 minutes with the patient's total care. More than half of this time has been spent with direct contact with the patient and his daughter. I have explained the issue of his ventricular ectopy. We have discussed possible evaluation and possible treatment. I have also reviewed outside records. I reviewed the Holter monitor today personally.

## 2014-07-05 NOTE — Progress Notes (Signed)
HPI I saw the patient recently in the office to follow-up cardiomyopathy. His ejection fraction most recently is 35-40%. He's tolerating his medications and feeling well. He had an esophageal stricture dilatation done. During the procedure he had many PVCs. Dr.  Laural Golden contacted our office. A 24-hour Holter was placed. During that time he had very large number of PVCs. He had 17. Run of ventricular tachycardia with a rate of 158. He has no symptoms. He is 81 years of age. I do not have access to electrolytes or magnesium at this time.  Allergies  Allergen Reactions  . Ace Inhibitors Other (See Comments)    unknown  . Nsaids Other (See Comments)    unknown  . Iodinated Diagnostic Agents Rash    Current Outpatient Prescriptions  Medication Sig Dispense Refill  . amLODipine (NORVASC) 10 MG tablet TAKE 1 TABLET BY MOUTH DAILY 90 tablet 3  . aspirin EC 81 MG tablet Take 81 mg by mouth daily.    . carvedilol (COREG) 25 MG tablet TAKE 1 TABLET (25 MG TOTAL) BY MOUTH 2 (TWO) TIMES DAILY WITH A MEAL. (Patient taking differently: 1/2 tab BID) 180 tablet 3  . DEXILANT 60 MG capsule TAKE 1 CAPSULE BY MOUTH EVERY DAY 90 capsule 2  . furosemide (LASIX) 20 MG tablet TAKE 1 TABLET (20 MG TOTAL) BY MOUTH DAILY. 90 tablet 3  . glipiZIDE (GLUCOTROL XL) 5 MG 24 hr tablet Take 1 tablet by mouth 2 (two) times daily.     . metFORMIN (GLUCOPHAGE) 500 MG tablet Take 1,000 mg by mouth 2 (two) times daily with a meal. Two in am and one in pm    . nitroGLYCERIN (NITROSTAT) 0.4 MG SL tablet Place 0.4 mg under the tongue every 5 (five) minutes as needed. For chest pains    . olmesartan (BENICAR) 40 MG tablet Take 1 tablet (40 mg total) by mouth daily. 90 tablet 3  . simvastatin (ZOCOR) 20 MG tablet Take 1 tablet (20 mg total) by mouth at bedtime. 90 tablet 3   No current facility-administered medications for this visit.    History   Social History  . Marital Status: Married    Spouse Name: N/A    Number of  Children: N/A  . Years of Education: N/A   Occupational History  . Not on file.   Social History Main Topics  . Smoking status: Former Smoker -- 0.30 packs/day for 30 years    Types: Cigarettes    Quit date: 06/07/1978  . Smokeless tobacco: Never Used     Comment: 40 yrs ago  . Alcohol Use: No  . Drug Use: No  . Sexual Activity: Not on file   Other Topics Concern  . Not on file   Social History Narrative   Family history Family history reveals no evidence of significant coronary disease or arrhythmias.  Past Medical History  Diagnosis Date  . Nipple pain     resolved october 2010  . Leg cramps     night time  . Burping     with hiccups ...question reflux... improved 03/2009  . ACE inhibitor intolerance     ...tolerates ARB  . GI bleeding     ...lower...2002  . Allergic reaction to contrast dye   . Renal insufficiency april 15,2010    ...mild...creatine 1.5.Marland Kitchen  Marland Kitchen Diabetes mellitus     for about 20 yrs.  . Dyslipidemia   . Hypertension   . Atrial fibrillation  atrial fibrillation ..postopCABG....brief treatmentamiodarone...stopped.Marland Kitchen.NSR  . Ejection fraction < 50%     EF 40-45%, echo, December, 2008 / EF 20-25%, echo, August, 2011 /  EF 35-40%, echo, April, 2012  . Mitral regurgitation     ...mild..echo...2008...mild/moderate by echo 01/2010  . CAD (coronary artery disease)     EF 40-45%, December, 2008  /  ejection fraction 20-25%, echo, August, 2011  . CHF (congestive heart failure)     Chronic systolic  . Cardiomyopathy     EF was noted to be worse August, 2011.  No ischemic workup done at that time.  Decision made to treat LV dysfunction and followup and make other decisions later  . Shoulder injury     Patient fell (not syncope) April, 2012  . Prostate disorder   . Thyroid condition   . GERD (gastroesophageal reflux disease)   . Dysphagia   . Type II or unspecified type diabetes mellitus without mention of complication, not stated as uncontrolled   .  Diverticulitis   . Hx of CABG     2008    Past Surgical History  Procedure Laterality Date  . Coronary artery bypass graft      december 2008  . Tonsillectomy    . Rotator cuff repair    . Myrinfoplasty    . Colonoscopy  04/2001  . Esophagogastroduodenoscopy N/A 06/27/2014    Procedure: ESOPHAGOGASTRODUODENOSCOPY (EGD);  Surgeon: Rogene Houston, MD;  [facility]: [facility];  Service: Endoscopy;  Laterality: N/A;  830  . Maloney dilation N/A 06/27/2014    Procedure: Venia Minks DILATION;  Surgeon: Rogene Houston, MD;  Location: [facility];  Service: Endoscopy;  Laterality: N/A;    Patient Active Problem List   Diagnosis Date Noted  . Chronic systolic CHF (congestive heart failure) 01/24/2013  . Hx of CABG   . Dysphagia 08/17/2011  . Ejection fraction < 50%   . Prostate disorder   . Thyroid condition   . Burping   . Allergic reaction to contrast dye   . Dyslipidemia   . Hypertension   . Atrial fibrillation   . Leg cramps   . ACE inhibitor intolerance   . GI bleeding   . Mitral regurgitation   . CAD (coronary artery disease)   . Cardiomyopathy   . Shoulder injury   . Renal insufficiency 09/19/2008    ROS  Patient denies fever, chills, headache, sweats, rash, change in vision, change in hearing, chest pain, cough, nausea or vomiting, urinary symptoms. All other systems are reviewed and are negative.  PHYSICAL EXAM Patient is here today with his daughter. He is quite stable. He has no symptoms. He is oriented to person time and place. Affect is normal. Head is atraumatic. Sclera and conjunctiva are normal. There is no jugular venous distention. Lungs are clear. Respiratory effort is nonlabored. Cardiac exam reveals S1 and S2. The abdomen is soft. There is no peripheral edema. There are no musculoskeletal deformities. There are no skin rashes.  Filed Vitals:   07/05/14 1424  BP: 167/69  Pulse: 67  Height: 5\' 8"  (1.727 m)  Weight: 172 lb 12.8 oz (78.382 kg)  SpO2: 76%     EKG is not done today.  24-hour Holter monitor This study shows that the patient has a very large number of PVCs in the range of 30,000. He had one 7 beat run of ventricular tachycardia with a rate of 150.  ASSESSMENT & PLAN

## 2014-07-05 NOTE — Patient Instructions (Signed)
Your physician recommends that you schedule a follow-up appointment in: 3-4 weeks with Dr. Ron Parker  Your physician recommends that you continue on your current medications as directed. Please refer to the Current Medication list given to you today.  Your physician recommends that you return for lab work now BMP/MAG  Thank you for [facility]!!

## 2014-07-11 ENCOUNTER — Telehealth: Payer: Self-pay | Admitting: *Deleted

## 2014-07-11 NOTE — Telephone Encounter (Signed)
Daughter informed

## 2014-07-11 NOTE — Telephone Encounter (Signed)
-----   Message from Carlena Bjornstad, MD sent at 07/08/2014 11:13 AM EST ----- Please notify the patient that his potassium and magnesium are normal.

## 2014-07-24 ENCOUNTER — Encounter: Payer: Self-pay | Admitting: Cardiology

## 2014-07-24 ENCOUNTER — Ambulatory Visit (INDEPENDENT_AMBULATORY_CARE_PROVIDER_SITE_OTHER): Payer: Medicare Other | Admitting: Cardiology

## 2014-07-24 VITALS — BP 140/78 | HR 63 | Ht 68.0 in | Wt 171.0 lb

## 2014-07-24 DIAGNOSIS — I25810 Atherosclerosis of coronary artery bypass graft(s) without angina pectoris: Secondary | ICD-10-CM

## 2014-07-24 DIAGNOSIS — I493 Ventricular premature depolarization: Secondary | ICD-10-CM

## 2014-07-24 DIAGNOSIS — I5022 Chronic systolic (congestive) heart failure: Secondary | ICD-10-CM

## 2014-07-24 DIAGNOSIS — I429 Cardiomyopathy, unspecified: Secondary | ICD-10-CM

## 2014-07-24 MED ORDER — MAGNESIUM OXIDE 250 MG PO TABS: 250.0000 mg | ORAL_TABLET | Freq: Every day | ORAL | Status: DC

## 2014-07-24 NOTE — Assessment & Plan Note (Signed)
His volume status is completely controlled. No change in therapy.

## 2014-07-24 NOTE — Patient Instructions (Signed)
Your physician recommends that you schedule a follow-up appointment in: 6 weeks. Your physician has recommended you make the following change in your medication:  Start magnesium oxide 250 mg daily. This is over the counter. Continue all other medications the same.

## 2014-07-24 NOTE — Progress Notes (Signed)
Cardiology Office Note   Date:  07/24/2014   ID:  John Moore, DOB 05-27-1928, MRN [id number]  PCP:  Glenda Chroman., MD  Cardiologist:  Dola Argyle, MD   Chief Complaint  Patient presents with  . Appointment    Follow-up cardiomyopathy      History of Present Illness: John Moore is a 81 y.o. male who presents today to follow-up cardiomyopathy and ventricular ectopy. I saw him last July 05, 2014. He has a cardiomyopathy but has no significant symptoms. He also has a significant amount of ventricular ectopy with no symptoms. I decided to check his potassium and magnesium. Potassium was normal. Magnesium was low normal at 1.7.    Past Medical History  Diagnosis Date  . Nipple pain     resolved october 2010  . Leg cramps     night time  . Burping     with hiccups ...question reflux... improved 03/2009  . ACE inhibitor intolerance     ...tolerates ARB  . GI bleeding     ...lower...2002  . Allergic reaction to contrast dye   . Renal insufficiency april 15,2010    ...mild...creatine 1.5.Marland Kitchen  Marland Kitchen Diabetes mellitus     for about 20 yrs.  . Dyslipidemia   . Hypertension   . Atrial fibrillation     atrial fibrillation ..postopCABG....brief treatmentamiodarone...stopped.Marland Kitchen.NSR  . Ejection fraction < 50%     EF 40-45%, echo, December, 2008 / EF 20-25%, echo, August, 2011 /  EF 35-40%, echo, April, 2012  . Mitral regurgitation     ...mild..echo...2008...mild/moderate by echo 01/2010  . CAD (coronary artery disease)     EF 40-45%, December, 2008  /  ejection fraction 20-25%, echo, August, 2011  . CHF (congestive heart failure)     Chronic systolic  . Cardiomyopathy     EF was noted to be worse August, 2011.  No ischemic workup done at that time.  Decision made to treat LV dysfunction and followup and make other decisions later  . Shoulder injury     Patient fell (not syncope) April, 2012  . Prostate disorder   . Thyroid condition   . GERD (gastroesophageal reflux  disease)   . Dysphagia   . Type II or unspecified type diabetes mellitus without mention of complication, not stated as uncontrolled   . Diverticulitis   . Hx of CABG     2008    Past Surgical History  Procedure Laterality Date  . Coronary artery bypass graft      december 2008  . Tonsillectomy    . Rotator cuff repair    . Myrinfoplasty    . Colonoscopy  04/2001  . Esophagogastroduodenoscopy N/A 06/27/2014    Procedure: ESOPHAGOGASTRODUODENOSCOPY (EGD);  Surgeon: Rogene Houston, MD;  Location: [facility];  Service: Endoscopy;  Laterality: N/A;  830  . Maloney dilation N/A 06/27/2014    Procedure: Venia Minks DILATION;  Surgeon: Rogene Houston, MD;  Location: [facility];  Service: Endoscopy;  Laterality: N/A;    Patient Active Problem List   Diagnosis Date Noted  . Ventricular ectopy 07/05/2014  . Chronic systolic CHF (congestive heart failure) 01/24/2013  . Hx of CABG   . Dysphagia 08/17/2011  . Ejection fraction < 50%   . Prostate disorder   . Thyroid condition   . Burping   . Allergic reaction to contrast dye   . Dyslipidemia   . Hypertension   . Atrial fibrillation   . Leg cramps   .  ACE inhibitor intolerance   . GI bleeding   . Mitral regurgitation   . CAD (coronary artery disease)   . Cardiomyopathy   . Shoulder injury   . Renal insufficiency 09/19/2008      Current Outpatient Prescriptions  Medication Sig Dispense Refill  . amLODipine (NORVASC) 10 MG tablet TAKE 1 TABLET BY MOUTH DAILY 90 tablet 3  . aspirin EC 81 MG tablet Take 81 mg by mouth daily.    . carvedilol (COREG) 25 MG tablet TAKE 1 TABLET (25 MG TOTAL) BY MOUTH 2 (TWO) TIMES DAILY WITH A MEAL. (Patient taking differently: 1/2 tab twice daily) 180 tablet 3  . DEXILANT 60 MG capsule TAKE 1 CAPSULE BY MOUTH EVERY DAY 90 capsule 2  . furosemide (LASIX) 20 MG tablet TAKE 1 TABLET (20 MG TOTAL) BY MOUTH DAILY. 90 tablet 3  . glipiZIDE (GLUCOTROL XL) 5 MG 24 hr tablet Take 1 tablet by mouth 2 (two)  times daily. 2 tab AM 1 tab PM    . metFORMIN (GLUCOPHAGE) 500 MG tablet Take 1,000 mg by mouth 2 (two) times daily with a meal. Two in am and one in pm    . nitroGLYCERIN (NITROSTAT) 0.4 MG SL tablet Place 0.4 mg under the tongue every 5 (five) minutes as needed. For chest pains    . olmesartan (BENICAR) 40 MG tablet Take 1 tablet (40 mg total) by mouth daily. 90 tablet 3  . simvastatin (ZOCOR) 20 MG tablet Take 1 tablet (20 mg total) by mouth at bedtime. 90 tablet 3  . Magnesium Oxide 250 MG TABS Take 1 tablet (250 mg total) by mouth daily. 30 each 0   No current facility-administered medications for this visit.    Allergies:   Ace inhibitors; Nsaids; and Iodinated diagnostic agents    Social History:  The patient  reports that he quit smoking about 36 years ago. His smoking use included Cigarettes. He has a 9 pack-year smoking history. He has never used smokeless tobacco. He reports that he does not drink alcohol or use illicit drugs.   Family History:  Patient has no strong family history of coronary disease or ventricular ectopy.    ROS:  Please see the history of present illness.     Patient denies fever, chills, headache, sweats, rash, change in vision, change in hearing, chest pain, cough, nausea or vomiting, urinary symptoms. All other systems are reviewed and are negative.   PHYSICAL EXAM: VS:  BP 140/78 mmHg  Pulse 63  Ht 5\' 8"  (1.727 m)  Wt 171 lb (77.565 kg)  BMI 26.01 kg/m2  SpO2 98% , Patient is here with a family member. He is oriented to person time and place. Affect is normal. He feels fine. Head is atraumatic. Sclera and conjunctiva are normal. There is no jugular venous distention. Lungs are clear. Respiratory effort is not labored. Cardiac exam reveals an S1 and S2. Abdomen is soft. There is no peripheral edema. There are no musculoskeletal deformities. There are no skin rashes.  EKG:   EKG is not done today.   Recent Labs: No results found for requested labs  within last 365 days.    Lipid Panel    Component Value Date/Time   CHOL  06/08/2007 0450    69        ATP III CLASSIFICATION:  <200     mg/dL   Desirable  200-239  mg/dL   Borderline High  >=240    mg/dL   High  TRIG 54 06/08/2007 0450   HDL 24* 06/08/2007 0450   CHOLHDL 2.9 06/08/2007 0450   VLDL 11 06/08/2007 0450   LDLCALC  06/08/2007 0450    34        Total Cholesterol/HDL:CHD Risk Coronary Heart Disease Risk Table                     Men   Women  1/2 Average Risk   3.4   3.3  Average Risk       5.0   4.4  2 X Average Risk   9.6   7.1  3 X Average Risk  23.4   11.0        Use the calculated Patient Ratio above and the CHD Risk Table to determine the patient's CHD Risk.        ATP III CLASSIFICATION (LDL):  <100     mg/dL   Optimal  100-129  mg/dL   Near or Above                    Optimal  130-159  mg/dL   Borderline  160-189  mg/dL   High  >190     mg/dL   Very High      Wt Readings from Last 3 Encounters:  07/24/14 171 lb (77.565 kg)  07/05/14 172 lb 12.8 oz (78.382 kg)  06/27/14 171 lb (77.565 kg)      Current medicines are reviewed. The family member that is with him out his medicines for him. She understands the medicines.       ASSESSMENT AND PLAN:

## 2014-07-24 NOTE — Assessment & Plan Note (Signed)
The patient has significant left ventricular dysfunction. He is completely stable on his current medicines with no symptoms. It is possible that he could have decreased LV function related to his high volume of PVCs. However, I am very hasn't had to push aggressive therapy for this.

## 2014-07-24 NOTE — Assessment & Plan Note (Signed)
I have carefully considered whether an antiarrhythmic should be added. He has no symptoms. He has not had syncope or presyncope. The reason to add antiarrhythmic therapy would be for the possibility that it could help his left ventricular function. There is no way to predict this. In his case, I feel it is most prudent not to use dangerous medications. His magnesium is low normal. I have chosen to add 250 mg of oral magnesium oxide to his medicines. He knows to stop this medicine if he gets any GI symptoms including diarrhea. I will see him back for follow-up to see if this appears to affect him at all.

## 2014-09-05 ENCOUNTER — Encounter: Payer: Self-pay | Admitting: *Deleted

## 2014-09-11 ENCOUNTER — Encounter: Payer: Self-pay | Admitting: Cardiology

## 2014-09-11 ENCOUNTER — Ambulatory Visit (INDEPENDENT_AMBULATORY_CARE_PROVIDER_SITE_OTHER): Payer: Medicare Other | Admitting: Cardiology

## 2014-09-11 VITALS — BP 150/62 | HR 46 | Ht 68.0 in | Wt 172.0 lb

## 2014-09-11 DIAGNOSIS — I493 Ventricular premature depolarization: Secondary | ICD-10-CM | POA: Diagnosis not present

## 2014-09-11 DIAGNOSIS — I5022 Chronic systolic (congestive) heart failure: Secondary | ICD-10-CM | POA: Diagnosis not present

## 2014-09-11 DIAGNOSIS — I10 Essential (primary) hypertension: Secondary | ICD-10-CM | POA: Diagnosis not present

## 2014-09-11 DIAGNOSIS — I429 Cardiomyopathy, unspecified: Secondary | ICD-10-CM

## 2014-09-11 DIAGNOSIS — I25810 Atherosclerosis of coronary artery bypass graft(s) without angina pectoris: Secondary | ICD-10-CM | POA: Diagnosis not present

## 2014-09-11 NOTE — Assessment & Plan Note (Signed)
His volume status is stable. No change in therapy. 

## 2014-09-11 NOTE — Assessment & Plan Note (Signed)
Patient is on the medications that he can tolerate for his left ventricular dysfunction. No change in therapy.

## 2014-09-11 NOTE — Assessment & Plan Note (Signed)
Holter monitor had shown high-volume PVCs. I've been concerned about this. I decided to add a small dose of daily magnesium because his magnesium was borderline low. He stated this without difficulty and feels fine. On his 12-lead EKG today there is no ectopy. On physical exam he has definite premature beats, but they seem less frequent in the past. Of course this is only a very limited sample.   I feel it is most prudent to continue the same therapy. I feel we should not proceed with any type of antiarrhythmic therapy. It is also of note that is possible that some of the high count of PVCs on his Holter or actually PACs. For now I plan to continue his current therapy keeping him on a small dose of over-the-counter magnesium daily.

## 2014-09-11 NOTE — Patient Instructions (Signed)
Your physician recommends that you schedule a follow-up appointment in: 3 months. You will receive a reminder letter in the mail in about 1-2 months reminding you to call and schedule your appointment. If you don't receive this letter, please contact our office. Your physician recommends that you continue on your current medications as directed. Please refer to the Current Medication list given to you today. 

## 2014-09-11 NOTE — Progress Notes (Signed)
Cardiology Office Note   Date:  09/11/2014   ID:  John Moore, DOB 21-Jan-1928, MRN [id number]  PCP:  Glenda Chroman., MD  Cardiologist:  Dola Argyle, MD   Chief Complaint  Patient presents with  . Appointment    Follow-up cardiomyopathy and ventricular ectopy      History of Present Illness: John Moore is a 81 y.o. male who presents today to follow-up cardiomyopathy and ventricular ectopy. He is 81 years of age. He feels fine. However we have noted that he has a large volume of PVCs. He has not had syncope or presyncope. His potassium was normal. When I had checked his labs his magnesium was slightly low at 1.7. I decided to put him on magnesium daily. He takes a pill each day. He returns today for follow-up. He continues to feel fine. He does not feel the PVCs.    Past Medical History  Diagnosis Date  . Nipple pain     resolved october 2010  . Leg cramps     night time  . Burping     with hiccups ...question reflux... improved 03/2009  . ACE inhibitor intolerance     ...tolerates ARB  . GI bleeding     ...lower...2002  . Allergic reaction to contrast dye   . Renal insufficiency april 15,2010    ...mild...creatine 1.5.Marland Kitchen  Marland Kitchen Diabetes mellitus     for about 20 yrs.  . Dyslipidemia   . Hypertension   . Atrial fibrillation     atrial fibrillation ..postopCABG....brief treatmentamiodarone...stopped.Marland Kitchen.NSR  . Ejection fraction < 50%     EF 40-45%, echo, December, 2008 / EF 20-25%, echo, August, 2011 /  EF 35-40%, echo, April, 2012  . Mitral regurgitation     ...mild..echo...2008...mild/moderate by echo 01/2010  . CAD (coronary artery disease)     EF 40-45%, December, 2008  /  ejection fraction 20-25%, echo, August, 2011  . CHF (congestive heart failure)     Chronic systolic  . Cardiomyopathy     EF was noted to be worse August, 2011.  No ischemic workup done at that time.  Decision made to treat LV dysfunction and followup and make other decisions later  . Shoulder  injury     Patient fell (not syncope) April, 2012  . Prostate disorder   . Thyroid condition   . GERD (gastroesophageal reflux disease)   . Dysphagia   . Type II or unspecified type diabetes mellitus without mention of complication, not stated as uncontrolled   . Diverticulitis   . Hx of CABG     2008    Past Surgical History  Procedure Laterality Date  . Coronary artery bypass graft      december 2008  . Tonsillectomy    . Rotator cuff repair    . Myrinfoplasty    . Colonoscopy  04/2001  . Esophagogastroduodenoscopy N/A 06/27/2014    Procedure: ESOPHAGOGASTRODUODENOSCOPY (EGD);  Surgeon: Rogene Houston, MD;  [facility];  Service: Endoscopy;  Laterality: N/A;  830  . Maloney dilation N/A 06/27/2014    Procedure: Venia Minks DILATION;  Surgeon: Rogene Houston, MD;  [facility]: [facility];  Service: Endoscopy;  Laterality: N/A;    Patient Active Problem List   Diagnosis Date Noted  . Ventricular ectopy 07/05/2014  . Chronic systolic CHF (congestive heart failure) 01/24/2013  . Hx of CABG   . Dysphagia 08/17/2011  . Ejection fraction < 50%   . Prostate disorder   .  Thyroid condition   . Burping   . Allergic reaction to contrast dye   . Dyslipidemia   . Hypertension   . Atrial fibrillation   . Leg cramps   . ACE inhibitor intolerance   . GI bleeding   . Mitral regurgitation   . CAD (coronary artery disease)   . Cardiomyopathy   . Shoulder injury   . Renal insufficiency 09/19/2008      Current Outpatient Prescriptions  Medication Sig Dispense Refill  . amLODipine (NORVASC) 10 MG tablet TAKE 1 TABLET BY MOUTH DAILY 90 tablet 3  . aspirin EC 81 MG tablet Take 81 mg by mouth daily.    . carvedilol (COREG) 25 MG tablet TAKE 1 TABLET (25 MG TOTAL) BY MOUTH 2 (TWO) TIMES DAILY WITH A MEAL. (Patient taking differently: 1/2 tab twice daily) 180 tablet 3  . DEXILANT 60 MG capsule TAKE 1 CAPSULE BY MOUTH EVERY DAY 90 capsule 2  . furosemide (LASIX) 20 MG tablet  TAKE 1 TABLET (20 MG TOTAL) BY MOUTH DAILY. 90 tablet 3  . glipiZIDE (GLUCOTROL XL) 5 MG 24 hr tablet Take 1 tablet by mouth 2 (two) times daily. 2 tab AM 1 tab PM    . Magnesium Oxide 250 MG TABS Take 1 tablet (250 mg total) by mouth daily. 30 each 0  . metFORMIN (GLUCOPHAGE) 500 MG tablet Take 1,000 mg by mouth 2 (two) times daily with a meal. Two in am and one in pm    . nitroGLYCERIN (NITROSTAT) 0.4 MG SL tablet Place 0.4 mg under the tongue every 5 (five) minutes as needed. For chest pains    . olmesartan (BENICAR) 40 MG tablet Take 1 tablet (40 mg total) by mouth daily. 90 tablet 3  . simvastatin (ZOCOR) 20 MG tablet Take 1 tablet (20 mg total) by mouth at bedtime. 90 tablet 3   No current facility-administered medications for this visit.    Allergies:   Ace inhibitors; Nsaids; and Iodinated diagnostic agents    Social History:  The patient  reports that he quit smoking about 36 years ago. His smoking use included Cigarettes. He has a 9 pack-year smoking history. He has never used smokeless tobacco. He reports that he does not drink alcohol or use illicit drugs.   Family History:  The patient's family history includes Heart attack in his mother.    ROS:  Please see the history of present illness.     Patient denies fever, chills, headache, sweats, rash, change in vision, change in hearing, chest pain, cough, nausea or vomiting, urinary symptoms. All other systems are reviewed and are negative.   PHYSICAL EXAM: VS:  BP 150/62 mmHg  Pulse 46  Ht 5\' 8"  (1.727 m)  Wt 172 lb (78.019 kg)  BMI 26.16 kg/m2  SpO2 98% , Patient is stable. He is here with his daughter. He is oriented to person time and place. Affect is normal. Head is atraumatic. Sclera and conjunctiva are normal. There is no jugulovenous distention. Lungs are clear. Respiratory effort is nonlabored. Cardiac exam reveals an S1 and S2. I do hear premature beats. They appear to be less frequent by exam today. Abdomen is soft.  There is no peripheral edema. There are no musculoskeletal deformities. There are no skin rashes.  EKG:   EKG is done today and reviewed by me. There is an old interventricular conduction delay. There is sinus rhythm. On the brief 12-lead EKG, there is no ectopy.   Recent Labs: No results  found for requested labs within last 365 days.    Lipid Panel    Component Value Date/Time   CHOL  06/08/2007 0450    69        ATP III CLASSIFICATION:  <200     mg/dL   Desirable  200-239  mg/dL   Borderline High  >=240    mg/dL   High          TRIG 54 06/08/2007 0450   HDL 24* 06/08/2007 0450   CHOLHDL 2.9 06/08/2007 0450   VLDL 11 06/08/2007 0450   LDLCALC  06/08/2007 0450    34        Total Cholesterol/HDL:CHD Risk Coronary Heart Disease Risk Table                     Men   Women  1/2 Average Risk   3.4   3.3  Average Risk       5.0   4.4  2 X Average Risk   9.6   7.1  3 X Average Risk  23.4   11.0        Use the calculated Patient Ratio above and the CHD Risk Table to determine the patient's CHD Risk.        ATP III CLASSIFICATION (LDL):  <100     mg/dL   Optimal  100-129  mg/dL   Near or Above                    Optimal  130-159  mg/dL   Borderline  160-189  mg/dL   High  >190     mg/dL   Very High      Wt Readings from Last 3 Encounters:  09/11/14 172 lb (78.019 kg)  07/24/14 171 lb (77.565 kg)  07/05/14 172 lb 12.8 oz (78.382 kg)      Current medicines are reviewed       ASSESSMENT AND PLAN:

## 2014-09-30 ENCOUNTER — Other Ambulatory Visit: Payer: Self-pay | Admitting: Cardiology

## 2014-12-26 ENCOUNTER — Encounter: Payer: Self-pay | Admitting: Cardiology

## 2014-12-26 ENCOUNTER — Ambulatory Visit (INDEPENDENT_AMBULATORY_CARE_PROVIDER_SITE_OTHER): Payer: Medicare Other | Admitting: Cardiology

## 2014-12-26 VITALS — BP 158/66 | HR 54 | Ht 68.0 in | Wt 170.1 lb

## 2014-12-26 DIAGNOSIS — I429 Cardiomyopathy, unspecified: Secondary | ICD-10-CM

## 2014-12-26 DIAGNOSIS — I25810 Atherosclerosis of coronary artery bypass graft(s) without angina pectoris: Secondary | ICD-10-CM

## 2014-12-26 DIAGNOSIS — I5022 Chronic systolic (congestive) heart failure: Secondary | ICD-10-CM

## 2014-12-26 DIAGNOSIS — I493 Ventricular premature depolarization: Secondary | ICD-10-CM | POA: Diagnosis not present

## 2014-12-26 NOTE — Assessment & Plan Note (Signed)
The patient's ejection fraction was noted to be lower than previously in 2011. I decided not to proceed with an ischemic workup. I have tried to adjust his medications. He is stable without significant symptoms. No change in therapy.

## 2014-12-26 NOTE — Progress Notes (Signed)
Cardiology Office Note   Date:  12/26/2014   ID:  John Moore, DOB 1928/01/11, MRN [id number]  PCP:  Glenda Chroman., MD  Cardiologist:  Dola Argyle, MD   Chief Complaint  Patient presents with  . Appointment    follow-up cardiomyopathy and ventricular ectopy      History of Present Illness: John Moore is a 81 y.o. male who presents today for the follow-up of cardiomyopathy and ventricular ectopy. He is 81 years of age. He says he feels fine. I had noted a large volume PVCs over time. He has never had syncope or presyncope. His potassium was normal. His magnesium was slightly reduced at 1.7. I decided that an aggressive workup was not indicated. I decided to treat him with a small dose of oral magnesium daily. He is continuing this. I feel that clinically his ectopy is reduced. I have not done any repeat monitoring. I am comfortable that this is a good approach for him.    Past Medical History  Diagnosis Date  . Nipple pain     resolved october 2010  . Leg cramps     night time  . Burping     with hiccups ...question reflux... improved 03/2009  . ACE inhibitor intolerance     ...tolerates ARB  . GI bleeding     ...lower...2002  . Allergic reaction to contrast dye   . Renal insufficiency april 15,2010    ...mild...creatine 1.5.Marland Kitchen  Marland Kitchen Diabetes mellitus     for about 20 yrs.  . Dyslipidemia   . Hypertension   . Atrial fibrillation     atrial fibrillation ..postopCABG....brief treatmentamiodarone...stopped.Marland Kitchen.NSR  . Ejection fraction < 50%     EF 40-45%, echo, December, 2008 / EF 20-25%, echo, August, 2011 /  EF 35-40%, echo, April, 2012  . Mitral regurgitation     ...mild..echo...2008...mild/moderate by echo 01/2010  . CAD (coronary artery disease)     EF 40-45%, December, 2008  /  ejection fraction 20-25%, echo, August, 2011  . CHF (congestive heart failure)     Chronic systolic  . Cardiomyopathy     EF was noted to be worse August, 2011.  No ischemic workup  done at that time.  Decision made to treat LV dysfunction and followup and make other decisions later  . Shoulder injury     Patient fell (not syncope) April, 2012  . Prostate disorder   . Thyroid condition   . GERD (gastroesophageal reflux disease)   . Dysphagia   . Type II or unspecified type diabetes mellitus without mention of complication, not stated as uncontrolled   . Diverticulitis   . Hx of CABG     2008    Past Surgical History  Procedure Laterality Date  . Coronary artery bypass graft      december 2008  . Tonsillectomy    . Rotator cuff repair    . Myrinfoplasty    . Colonoscopy  04/2001  . Esophagogastroduodenoscopy N/A 06/27/2014    Procedure: ESOPHAGOGASTRODUODENOSCOPY (EGD);  Surgeon: Rogene Houston, MD;  [facility];  Service: Endoscopy;  Laterality: N/A;  830  . Maloney dilation N/A 06/27/2014    Procedure: Venia Minks DILATION;  Surgeon: Rogene Houston, MD;  [facility];  Service: Endoscopy;  Laterality: N/A;    Patient Active Problem List   Diagnosis Date Noted  . Ventricular ectopy 07/05/2014  . Chronic systolic CHF (congestive heart failure) 01/24/2013  . Hx of CABG   .  Dysphagia 08/17/2011  . Ejection fraction < 50%   . Prostate disorder   . Thyroid condition   . Burping   . Allergic reaction to contrast dye   . Dyslipidemia   . Hypertension   . Atrial fibrillation   . Leg cramps   . ACE inhibitor intolerance   . GI bleeding   . Mitral regurgitation   . CAD (coronary artery disease)   . Cardiomyopathy   . Shoulder injury   . Renal insufficiency 09/19/2008      Current Outpatient Prescriptions  Medication Sig Dispense Refill  . amLODipine (NORVASC) 10 MG tablet TAKE 1 TABLET BY MOUTH DAILY 90 tablet 3  . aspirin EC 81 MG tablet Take 81 mg by mouth daily.    . carvedilol (COREG) 25 MG tablet Take 12.5 mg by mouth 2 (two) times daily.    Marland Kitchen DEXILANT 60 MG capsule TAKE 1 CAPSULE BY MOUTH EVERY DAY 90 capsule 2  .  furosemide (LASIX) 20 MG tablet TAKE 1 TABLET BY MOUTH DAILY 90 tablet 3  . glipiZIDE (GLUCOTROL XL) 5 MG 24 hr tablet Take 1 tablet by mouth 2 (two) times daily. 2 tab AM 1 tab PM    . Magnesium Oxide 250 MG TABS Take 1 tablet (250 mg total) by mouth daily. 30 each 0  . metFORMIN (GLUCOPHAGE) 500 MG tablet Take 1,000 mg by mouth 2 (two) times daily with a meal. Two in am and one in pm    . nitroGLYCERIN (NITROSTAT) 0.4 MG SL tablet Place 0.4 mg under the tongue every 5 (five) minutes as needed. For chest pains    . olmesartan (BENICAR) 40 MG tablet Take 1 tablet (40 mg total) by mouth daily. 90 tablet 3  . simvastatin (ZOCOR) 20 MG tablet Take 1 tablet (20 mg total) by mouth at bedtime. 90 tablet 3   No current facility-administered medications for this visit.    Allergies:   Ace inhibitors; Nsaids; and Iodinated diagnostic agents    Social History:  The patient  reports that he quit smoking about 36 years ago. His smoking use included Cigarettes. He has a 9 pack-year smoking history. He has never used smokeless tobacco. He reports that he does not drink alcohol or use illicit drugs.   Family History:  The patient's family history includes Heart attack in his mother.    ROS:  Please see the history of present illness.     Patient denies fever, chills, headache, sweats, rash, change in vision, change in hearing, chest pain, cough, nausea or vomiting, urinary symptoms. All other systems are reviewed and are negative.   PHYSICAL EXAM: VS:  BP 158/66 mmHg  Pulse 54  Ht 5\' 8"  (1.727 m)  Wt 170 lb 1.9 oz (77.166 kg)  BMI 25.87 kg/m2  SpO2 98% , Patient is oriented to person time and place. Affect is normal. Head is atraumatic. Sclera and conjunctiva are normal. There is no jugulovenous distention. Lungs are clear. Respiratory effort is nonlabored. Cardiac exam reveals S1 and S2. The abdomen is soft. There is no peripheral edema. There are no musculoskeletal deformities. There are no skin  rashes.  EKG:   EKG is not done today.   Recent Labs: No results found for requested labs within last 365 days.    Lipid Panel    Component Value Date/Time   CHOL  06/08/2007 0450    69        ATP III CLASSIFICATION:  <200  mg/dL   Desirable  200-239  mg/dL   Borderline High  >=240    mg/dL   High          TRIG 54 06/08/2007 0450   HDL 24* 06/08/2007 0450   CHOLHDL 2.9 06/08/2007 0450   VLDL 11 06/08/2007 0450   LDLCALC  06/08/2007 0450    34        Total Cholesterol/HDL:CHD Risk Coronary Heart Disease Risk Table                     Men   Women  1/2 Average Risk   3.4   3.3  Average Risk       5.0   4.4  2 X Average Risk   9.6   7.1  3 X Average Risk  23.4   11.0        Use the calculated Patient Ratio above and the CHD Risk Table to determine the patient's CHD Risk.        ATP III CLASSIFICATION (LDL):  <100     mg/dL   Optimal  100-129  mg/dL   Near or Above                    Optimal  130-159  mg/dL   Borderline  160-189  mg/dL   High  >190     mg/dL   Very High      Wt Readings from Last 3 Encounters:  12/26/14 170 lb 1.9 oz (77.166 kg)  09/11/14 172 lb (78.019 kg)  07/24/14 171 lb (77.565 kg)      Current medicines are reviewed  The patient and family understand his medications.     ASSESSMENT AND PLAN:

## 2014-12-26 NOTE — Assessment & Plan Note (Signed)
Hold her monitor in January, 2016 revealed a high volume of PVCs. Clinically I felt that he was stable. I felt in his case it was most prudent not to launch an aggressive workup. I also felt that using antiarrhythmics was not a good plan for him. His magnesium level was slightly reduced. I decided to put him on a small dose of oral magnesium daily. He continues to take this and he feels very well. I feel that this is a good approach for him in this case. No change in therapy.

## 2014-12-26 NOTE — Assessment & Plan Note (Signed)
Volume status is stable. No change in therapy. 

## 2014-12-26 NOTE — Patient Instructions (Signed)
Continue all current medications. Your physician wants you to follow up in: 6 months.  You will receive a reminder letter in the mail one-two months in advance.  If you don't receive a letter, please call our office to schedule the follow up appointment   

## 2014-12-30 ENCOUNTER — Other Ambulatory Visit: Payer: Self-pay | Admitting: Cardiology

## 2014-12-31 ENCOUNTER — Other Ambulatory Visit: Payer: Self-pay

## 2014-12-31 MED ORDER — CARVEDILOL 25 MG PO TABS: 12.5000 mg | ORAL_TABLET | Freq: Two times a day (BID) | ORAL | Status: DC

## 2015-01-08 ENCOUNTER — Encounter (INDEPENDENT_AMBULATORY_CARE_PROVIDER_SITE_OTHER): Payer: Self-pay | Admitting: *Deleted

## 2015-06-10 DIAGNOSIS — N289 Disorder of kidney and ureter, unspecified: Secondary | ICD-10-CM | POA: Diagnosis not present

## 2015-06-10 DIAGNOSIS — Z418 Encounter for other procedures for purposes other than remedying health state: Secondary | ICD-10-CM | POA: Diagnosis not present

## 2015-06-10 DIAGNOSIS — E7800 Pure hypercholesterolemia, unspecified: Secondary | ICD-10-CM | POA: Diagnosis not present

## 2015-06-10 DIAGNOSIS — I10 Essential (primary) hypertension: Secondary | ICD-10-CM | POA: Diagnosis not present

## 2015-06-10 DIAGNOSIS — J069 Acute upper respiratory infection, unspecified: Secondary | ICD-10-CM | POA: Diagnosis not present

## 2015-06-13 ENCOUNTER — Encounter: Payer: Self-pay | Admitting: *Deleted

## 2015-06-13 ENCOUNTER — Ambulatory Visit (INDEPENDENT_AMBULATORY_CARE_PROVIDER_SITE_OTHER): Payer: Medicare Other | Admitting: Cardiology

## 2015-06-13 ENCOUNTER — Encounter: Payer: Self-pay | Admitting: Cardiology

## 2015-06-13 VITALS — BP 152/70 | HR 63 | Ht 68.0 in | Wt 166.4 lb

## 2015-06-13 DIAGNOSIS — I255 Ischemic cardiomyopathy: Secondary | ICD-10-CM

## 2015-06-13 DIAGNOSIS — I2510 Atherosclerotic heart disease of native coronary artery without angina pectoris: Secondary | ICD-10-CM | POA: Diagnosis not present

## 2015-06-13 DIAGNOSIS — E785 Hyperlipidemia, unspecified: Secondary | ICD-10-CM

## 2015-06-13 DIAGNOSIS — N183 Chronic kidney disease, stage 3 unspecified: Secondary | ICD-10-CM

## 2015-06-13 NOTE — Patient Instructions (Signed)
Your physician recommends that you continue on your current medications as directed. Please refer to the Current Medication list given to you today. Your physician recommends that you schedule a follow-up appointment in: 6 months. You will receive a reminder letter in the mail in about 4 months reminding you to call and schedule your appointment. If you don't receive this letter, please contact our office. 

## 2015-06-13 NOTE — Progress Notes (Signed)
Cardiology Office Note  Date: 06/13/2015   ID: John Moore, DOB 1928/03/22, MRN [id number]  PCP: Glenda Chroman., MD  Primary Cardiologist: Rozann Lesches, MD   Chief Complaint  Patient presents with  . Coronary Artery Disease  . Cardiomyopathy    History of Present Illness: John Moore is an 82 y.o. male former patient of Dr. Ron Parker, now establishing follow-up with me. I reviewed extensive records and updated his chart. He last saw Dr. Ron Parker in July, this is our first meeting in the office.   He is here today with his daughter for a follow-up visit. He states that he was in the hospital back in December 2016 with congestive heart failure, managed at [facility]. It sounds like he had an echocardiogram done at that time. He was in rehabilitation subsequently, and now is back at home. He reports limited physical activity at baseline, typically NYHA class II dyspnea. He does not have any recurring angina symptoms or nitroglycerin use. Also denies having any sudden onset syncope. Has somewhat chronic right greater than left leg edema, although this has been improved since December.  We discussed his medications which are outlined below. Cardiac regimen includes aspirin, Coreg, Norvasc, Lasix, magnesium, Benicar, sublingual nitroglycerin glycerin, and Zocor.  From a cardiac perspective, he has been managed conservatively on medical therapy over the years by Dr. Ron Parker. Last echocardiogram from 2011 is reviewed below.  Aggressive ischemic workup has not been pursued, nor has ICD.  Past Medical History  Diagnosis Date  . Leg cramps     Nocturnal  . ACE inhibitor intolerance     Tolerates ARB  . GI bleeding 2002  . Allergic reaction to contrast dye   . CKD (chronic kidney disease) stage 3, GFR 30-59 ml/min   . Type 2 diabetes mellitus ([place])   . Dyslipidemia   . Essential hypertension   . Postoperative atrial fibrillation ([place])   . Ischemic cardiomyopathy   . CAD (coronary artery  disease)     Multivessel status post CABG 2008  . Chronic systolic heart failure ([place])   . Prostate disorder   . GERD (gastroesophageal reflux disease)   . Dysphagia   . Diverticulitis     Past Surgical History  Procedure Laterality Date  . Coronary artery bypass graft  05/2007    LIMA to LAD, SVG to diagonal, SVG to OM, SVG to RCA  . Tonsillectomy    . Rotator cuff repair    . Myrinfoplasty    . Colonoscopy  04/2001  . Esophagogastroduodenoscopy N/A 06/27/2014    Procedure: ESOPHAGOGASTRODUODENOSCOPY (EGD);  Surgeon: Rogene Houston, MD;  [facility];  Service: Endoscopy;  Laterality: N/A;  830  . Maloney dilation N/A 06/27/2014    Procedure: Venia Minks DILATION;  Surgeon: Rogene Houston, MD;  [facility];  Service: Endoscopy;  Laterality: N/A;    Current Outpatient Prescriptions  Medication Sig Dispense Refill  . amLODipine (NORVASC) 10 MG tablet TAKE 1 TABLET BY MOUTH DAILY 90 tablet 3  . aspirin EC 81 MG tablet Take 81 mg by mouth daily.    . carvedilol (COREG) 25 MG tablet Take 0.5 tablets (12.5 mg total) by mouth 2 (two) times daily. 30 tablet 6  . DEXILANT 60 MG capsule TAKE 1 CAPSULE BY MOUTH EVERY DAY 90 capsule 2  . furosemide (LASIX) 20 MG tablet TAKE 1 TABLET BY MOUTH DAILY 90 tablet 3  . glipiZIDE (GLUCOTROL XL) 5 MG 24 hr tablet Take 1  tablet by mouth 2 (two) times daily. 2 tab AM 1 tab PM    . Magnesium Oxide 250 MG TABS Take 1 tablet (250 mg total) by mouth daily. 30 each 0  . metFORMIN (GLUCOPHAGE) 500 MG tablet Take 1,000 mg by mouth 2 (two) times daily with a meal. Two in am and one in pm    . nitroGLYCERIN (NITROSTAT) 0.4 MG SL tablet Place 0.4 mg under the tongue every 5 (five) minutes as needed. For chest pains    . olmesartan (BENICAR) 40 MG tablet Take 1 tablet (40 mg total) by mouth daily. 90 tablet 3  . simvastatin (ZOCOR) 20 MG tablet Take 1 tablet (20 mg total) by mouth at bedtime. 90 tablet 3  . amoxicillin (AMOXIL) 500 MG capsule  Take 1 capsule by mouth 3 (three) times daily.     No current facility-administered medications for this visit.   Allergies:  Ace inhibitors; Nsaids; and Iodinated diagnostic agents   Social History: The patient  reports that he quit smoking about 37 years ago. His smoking use included Cigarettes. He has a 9 pack-year smoking history. He has never used smokeless tobacco. He reports that he does not drink alcohol or use illicit drugs.   ROS:  Please see the history of present illness. Otherwise, complete review of systems is positive for decreased hearing.  All other systems are reviewed and negative.   Physical Exam: VS:  BP 152/70 mmHg  Pulse 63  Ht 5\' 8"  (1.727 m)  Wt 166 lb 6.4 oz (75.479 kg)  BMI 25.31 kg/m2  SpO2 96%, BMI Body mass index is 25.31 kg/(m^2).  Wt Readings from Last 3 Encounters:  06/13/15 166 lb 6.4 oz (75.479 kg)  12/26/14 170 lb 1.9 oz (77.166 kg)  09/11/14 172 lb (78.019 kg)    General: Elderly male, appears comfortable at rest. HEENT: Conjunctiva and lids normal, oropharynx clear. Neck: Supple, no elevated JVP or carotid bruits, no thyromegaly. Lungs: Clear to auscultation, nonlabored breathing at rest. Cardiac: RRR with frequent ventricular ectopy, no S3, soft systolic murmur, no pericardial rub. Abdomen: Soft, nontender, bowel sounds present, no guarding or rebound. Extremities: One plus right leg edema, less on the left, distal pulses 2+. Skin: Warm and dry. Musculoskeletal: No kyphosis. Neuropsychiatric: Alert and oriented x3, affect grossly appropriate.  ECG:  Tracing from 09/11/2014 showed sinus bradycardia with prolonged PR interval and IVCD, left anterior fascicular block..  Recent Labwork:  January 2016: BUN 23, creatinine 1.6, potassium 5.1, magnesium 1.7  Other Studies Reviewed Today:  Echocardiogram 01/08/2010 [facility]) reported mildly dilated LV with LVEF 20-25%, restrictive diastolic filling pattern with increased filling pressures, moderate  left atrial enlargement, mildly reduced RV contraction, mild right atrial enlargement, sclerotic aortic valve, mild to moderate mitral regurgitation , mild tricuspid regurgitation, severe pulmonary hypertension with RVSP 63 mmHg.  Assessment and Plan:  1. Ischemic cardiomyopathy, LVEF was 20-25% as of 2011. Patient reports recent CHF exacerbation at [facility] back in December 2016. We will request his echocardiogram for follow-up, but anticipate continuing conservative medical management which has been the case over the years. He states that he is back to previous functional baseline. I did discuss with him obtaining daily weights and adjustment of Lasix at home if his weight goes up 2-3 pounds in 24 hours.  2. Multivessel CAD status post CABG in 2008. He does not report any active angina symptoms. Currently on well-rounded medical regimen.  3. CKD stage 3.  4. Hyperlipidemia, on Zocor, followed routinely by  Dr. Woody Seller.  Current medicines were reviewed with the patient today.  Disposition: FU with me in 6 months.   Signed, Satira Sark, MD, [facility] 06/13/2015 3:35 PM    [facility] [place], [place], [place] [address] Phone: [phone]; Fax: [phone]

## 2015-06-19 DIAGNOSIS — R5383 Other fatigue: Secondary | ICD-10-CM | POA: Diagnosis not present

## 2015-06-23 ENCOUNTER — Ambulatory Visit (INDEPENDENT_AMBULATORY_CARE_PROVIDER_SITE_OTHER): Payer: Medicare Other | Admitting: Internal Medicine

## 2015-06-23 ENCOUNTER — Encounter (INDEPENDENT_AMBULATORY_CARE_PROVIDER_SITE_OTHER): Payer: Self-pay | Admitting: Internal Medicine

## 2015-06-23 VITALS — BP 144/78 | HR 60 | Temp 97.3°F | Ht 68.5 in | Wt 162.3 lb

## 2015-06-23 DIAGNOSIS — I255 Ischemic cardiomyopathy: Secondary | ICD-10-CM | POA: Diagnosis not present

## 2015-06-23 DIAGNOSIS — R1310 Dysphagia, unspecified: Secondary | ICD-10-CM | POA: Diagnosis not present

## 2015-06-23 NOTE — Progress Notes (Signed)
Subjective:    Patient ID: John Moore, male    DOB: 1927/07/20, 82 y.o.   MRN: [id number]  HPI Here today for f/u. He was last seen in January of last year for dysphagia. He underwent an EGD which revealed ring like stricture GE Junction. He tells me he is not having any problems with dysphagia. He can eat what he wants. Appetite has been good.  He has lost 9 pounds since his last visit in January of 2016. BMs are normal. He has a BM every other day. No melena or BRRB. Admitted to [facility] x 2 in September and December for CHF. He did rehab at [facility].    .     06/27/2014: EGD with ED  Indications: Patient is 82 year old Caucasian male was chronic GERD complicated by distal esophageal stricture was dilated in March 2013 and he now presents with intermittent solid food dysphagia. Heartburn is well controlled with therapy.  Impression: Ringlike stricture at GE junction dilated by passing 27 [place] Maloney dilator. Small sliding hiatal hernia.   Procedure Date: 08/26/2011 Procedure: EGD with ED. Indications: Patient is an 82 year old Caucasian male who underwent EGD back in October 2012 have ulcerative esophagitis with a high-grade stricture which was dilated to 16.5 mm with a balloon. Patient was advised to return for office visit but he did not. He felt so much better he decided to cut his medication. However now he has developed intermittent solid food dysphagia. He is therefore undergoing repeat dilation. Impression: Healed esophagitis. Stricture at GE junction and dilated with a balloon to 18 mm. Small sliding hiatal hernia.   04/01/2011 EGD/ED; Dysphagia Impression: Ulcerated reflux esophagitis with high grade stricture at gastroesophageal junction. This  stricture was dilated with a balloon to 16.5 mm. 3 cm size sliding hiatal.   Review of Systems Past Medical History  Diagnosis Date  . Leg cramps     Nocturnal  . ACE inhibitor intolerance     Tolerates ARB  . GI bleeding 2002  . Allergic reaction to contrast dye   . CKD (chronic kidney disease) stage 3, GFR 30-59 ml/min   . Type 2 diabetes mellitus ([place])   . Dyslipidemia   . Essential hypertension   . Postoperative atrial fibrillation ([place])   . Ischemic cardiomyopathy   . CAD (coronary artery disease)     Multivessel status post CABG 2008  . Chronic systolic heart failure ([place])   . Prostate disorder   . GERD (gastroesophageal reflux disease)   . Dysphagia   . Diverticulitis     Past Surgical History  Procedure Laterality Date  . Coronary artery bypass graft  05/2007    LIMA to LAD, SVG to diagonal, SVG to OM, SVG to RCA  . Tonsillectomy    . Rotator cuff repair    . Myrinfoplasty    . Colonoscopy  04/2001  . Esophagogastroduodenoscopy N/A 06/27/2014    Procedure: ESOPHAGOGASTRODUODENOSCOPY (EGD);  Surgeon: Rogene Houston, MD;  [facility];  Service: Endoscopy;  Laterality: N/A;  830  . Maloney dilation N/A 06/27/2014    Procedure: Venia Minks DILATION;  Surgeon: Rogene Houston, MD;  [facility];  Service: Endoscopy;  Laterality: N/A;    Allergies  Allergen Reactions  . Ace Inhibitors Other (See Comments)    unknown  . Nsaids Other (See Comments)    unknown  . Iodinated Diagnostic Agents Rash    Current Outpatient Prescriptions on File Prior to Visit  Medication Sig Dispense Refill  .  amLODipine (NORVASC) 10 MG tablet TAKE 1 TABLET BY MOUTH DAILY 90 tablet 3  . aspirin EC 81 MG tablet Take 81 mg by mouth daily.    . carvedilol (COREG) 25 MG tablet Take 0.5 tablets (12.5 mg total) by mouth 2 (two) times daily. 30 tablet 6  . DEXILANT 60 MG capsule TAKE 1 CAPSULE BY MOUTH EVERY DAY 90 capsule 2  . furosemide (LASIX) 20 MG tablet TAKE 1 TABLET  BY MOUTH DAILY 90 tablet 3  . glipiZIDE (GLUCOTROL XL) 5 MG 24 hr tablet Take 1 tablet by mouth 2 (two) times daily. 2 tab AM 1 tab PM    . metFORMIN (GLUCOPHAGE) 500 MG tablet Take 1,000 mg by mouth 2 (two) times daily with a meal. Two in am and one in pm    . nitroGLYCERIN (NITROSTAT) 0.4 MG SL tablet Place 0.4 mg under the tongue every 5 (five) minutes as needed. For chest pains    . olmesartan (BENICAR) 40 MG tablet Take 1 tablet (40 mg total) by mouth daily. 90 tablet 3  . simvastatin (ZOCOR) 20 MG tablet Take 1 tablet (20 mg total) by mouth at bedtime. 90 tablet 3  . Magnesium Oxide 250 MG TABS Take 1 tablet (250 mg total) by mouth daily. 30 each 0   No current facility-administered medications on file prior to visit.        Objective:   Physical Exam Blood pressure 144/78, pulse 60, temperature 97.3 F (36.3 C), height 5' 8.5" (1.74 m), weight 162 lb 4.8 oz (73.619 kg).  Alert and oriented. Skin warm and dry. Oral mucosa is moist.   . Sclera anicteric, conjunctivae is pink. Thyroid not enlarged. No cervical lymphadenopathy. Lungs clear. Heart regular rate and rhythm.  Abdomen is soft. Bowel sounds are positive. No hepatomegaly. No abdominal masses felt. No tenderness.  No edema to lower extremities. Patient is alert and oriented.       Assessment & Plan:  Dysphagia. He is doing well. No problems swallowing. He will have an OV in one year. Continue the Dexilant.

## 2015-06-23 NOTE — Patient Instructions (Signed)
Continue the Dexilant. OV in 1 year.

## 2015-06-26 ENCOUNTER — Other Ambulatory Visit (INDEPENDENT_AMBULATORY_CARE_PROVIDER_SITE_OTHER): Payer: Self-pay | Admitting: Internal Medicine

## 2015-07-04 DIAGNOSIS — R5383 Other fatigue: Secondary | ICD-10-CM | POA: Diagnosis not present

## 2015-07-09 DIAGNOSIS — E1129 Type 2 diabetes mellitus with other diabetic kidney complication: Secondary | ICD-10-CM | POA: Diagnosis not present

## 2015-07-09 DIAGNOSIS — E1165 Type 2 diabetes mellitus with hyperglycemia: Secondary | ICD-10-CM | POA: Diagnosis not present

## 2015-07-09 DIAGNOSIS — I509 Heart failure, unspecified: Secondary | ICD-10-CM | POA: Diagnosis not present

## 2015-07-09 DIAGNOSIS — Z418 Encounter for other procedures for purposes other than remedying health state: Secondary | ICD-10-CM | POA: Diagnosis not present

## 2015-07-17 DIAGNOSIS — Z79899 Other long term (current) drug therapy: Secondary | ICD-10-CM | POA: Diagnosis not present

## 2015-08-19 DIAGNOSIS — I509 Heart failure, unspecified: Secondary | ICD-10-CM | POA: Diagnosis not present

## 2015-08-19 DIAGNOSIS — E7800 Pure hypercholesterolemia, unspecified: Secondary | ICD-10-CM | POA: Diagnosis not present

## 2015-08-19 DIAGNOSIS — E119 Type 2 diabetes mellitus without complications: Secondary | ICD-10-CM | POA: Diagnosis not present

## 2015-09-11 DIAGNOSIS — L570 Actinic keratosis: Secondary | ICD-10-CM | POA: Diagnosis not present

## 2015-09-22 DIAGNOSIS — Z8249 Family history of ischemic heart disease and other diseases of the circulatory system: Secondary | ICD-10-CM | POA: Diagnosis not present

## 2015-09-22 DIAGNOSIS — I130 Hypertensive heart and chronic kidney disease with heart failure and stage 1 through stage 4 chronic kidney disease, or unspecified chronic kidney disease: Secondary | ICD-10-CM | POA: Diagnosis present

## 2015-09-22 DIAGNOSIS — E11649 Type 2 diabetes mellitus with hypoglycemia without coma: Secondary | ICD-10-CM | POA: Diagnosis present

## 2015-09-22 DIAGNOSIS — Z888 Allergy status to other drugs, medicaments and biological substances status: Secondary | ICD-10-CM | POA: Diagnosis not present

## 2015-09-22 DIAGNOSIS — R0602 Shortness of breath: Secondary | ICD-10-CM | POA: Diagnosis not present

## 2015-09-22 DIAGNOSIS — Z79899 Other long term (current) drug therapy: Secondary | ICD-10-CM | POA: Diagnosis not present

## 2015-09-22 DIAGNOSIS — R7989 Other specified abnormal findings of blood chemistry: Secondary | ICD-10-CM | POA: Diagnosis present

## 2015-09-22 DIAGNOSIS — Z7984 Long term (current) use of oral hypoglycemic drugs: Secondary | ICD-10-CM | POA: Diagnosis not present

## 2015-09-22 DIAGNOSIS — E871 Hypo-osmolality and hyponatremia: Secondary | ICD-10-CM | POA: Diagnosis present

## 2015-09-22 DIAGNOSIS — E785 Hyperlipidemia, unspecified: Secondary | ICD-10-CM | POA: Diagnosis present

## 2015-09-22 DIAGNOSIS — I5023 Acute on chronic systolic (congestive) heart failure: Secondary | ICD-10-CM | POA: Diagnosis not present

## 2015-09-22 DIAGNOSIS — I5021 Acute systolic (congestive) heart failure: Secondary | ICD-10-CM | POA: Diagnosis not present

## 2015-09-22 DIAGNOSIS — Z7982 Long term (current) use of aspirin: Secondary | ICD-10-CM | POA: Diagnosis not present

## 2015-09-22 DIAGNOSIS — Z951 Presence of aortocoronary bypass graft: Secondary | ICD-10-CM | POA: Diagnosis not present

## 2015-09-22 DIAGNOSIS — I2510 Atherosclerotic heart disease of native coronary artery without angina pectoris: Secondary | ICD-10-CM | POA: Diagnosis present

## 2015-09-22 DIAGNOSIS — Z809 Family history of malignant neoplasm, unspecified: Secondary | ICD-10-CM | POA: Diagnosis not present

## 2015-09-22 DIAGNOSIS — E1122 Type 2 diabetes mellitus with diabetic chronic kidney disease: Secondary | ICD-10-CM | POA: Diagnosis present

## 2015-09-22 DIAGNOSIS — J9601 Acute respiratory failure with hypoxia: Secondary | ICD-10-CM | POA: Diagnosis present

## 2015-09-22 DIAGNOSIS — N189 Chronic kidney disease, unspecified: Secondary | ICD-10-CM | POA: Diagnosis present

## 2015-09-24 ENCOUNTER — Other Ambulatory Visit: Payer: Self-pay | Admitting: Cardiology

## 2015-09-29 DIAGNOSIS — E119 Type 2 diabetes mellitus without complications: Secondary | ICD-10-CM | POA: Diagnosis not present

## 2015-09-29 DIAGNOSIS — I5021 Acute systolic (congestive) heart failure: Secondary | ICD-10-CM | POA: Diagnosis not present

## 2015-09-29 DIAGNOSIS — E785 Hyperlipidemia, unspecified: Secondary | ICD-10-CM | POA: Diagnosis not present

## 2015-09-29 DIAGNOSIS — I2510 Atherosclerotic heart disease of native coronary artery without angina pectoris: Secondary | ICD-10-CM | POA: Diagnosis not present

## 2015-09-29 DIAGNOSIS — Z9981 Dependence on supplemental oxygen: Secondary | ICD-10-CM | POA: Diagnosis not present

## 2015-10-01 DIAGNOSIS — E785 Hyperlipidemia, unspecified: Secondary | ICD-10-CM | POA: Diagnosis not present

## 2015-10-01 DIAGNOSIS — Z9981 Dependence on supplemental oxygen: Secondary | ICD-10-CM | POA: Diagnosis not present

## 2015-10-01 DIAGNOSIS — E119 Type 2 diabetes mellitus without complications: Secondary | ICD-10-CM | POA: Diagnosis not present

## 2015-10-01 DIAGNOSIS — I2510 Atherosclerotic heart disease of native coronary artery without angina pectoris: Secondary | ICD-10-CM | POA: Diagnosis not present

## 2015-10-01 DIAGNOSIS — I5021 Acute systolic (congestive) heart failure: Secondary | ICD-10-CM | POA: Diagnosis not present

## 2015-10-02 DIAGNOSIS — E785 Hyperlipidemia, unspecified: Secondary | ICD-10-CM | POA: Diagnosis not present

## 2015-10-02 DIAGNOSIS — Z9981 Dependence on supplemental oxygen: Secondary | ICD-10-CM | POA: Diagnosis not present

## 2015-10-02 DIAGNOSIS — I5021 Acute systolic (congestive) heart failure: Secondary | ICD-10-CM | POA: Diagnosis not present

## 2015-10-02 DIAGNOSIS — I2510 Atherosclerotic heart disease of native coronary artery without angina pectoris: Secondary | ICD-10-CM | POA: Diagnosis not present

## 2015-10-02 DIAGNOSIS — E119 Type 2 diabetes mellitus without complications: Secondary | ICD-10-CM | POA: Diagnosis not present

## 2015-10-03 DIAGNOSIS — E785 Hyperlipidemia, unspecified: Secondary | ICD-10-CM | POA: Diagnosis not present

## 2015-10-03 DIAGNOSIS — E1129 Type 2 diabetes mellitus with other diabetic kidney complication: Secondary | ICD-10-CM | POA: Diagnosis not present

## 2015-10-03 DIAGNOSIS — R5383 Other fatigue: Secondary | ICD-10-CM | POA: Diagnosis not present

## 2015-10-03 DIAGNOSIS — Z299 Encounter for prophylactic measures, unspecified: Secondary | ICD-10-CM | POA: Diagnosis not present

## 2015-10-03 DIAGNOSIS — I2510 Atherosclerotic heart disease of native coronary artery without angina pectoris: Secondary | ICD-10-CM | POA: Diagnosis not present

## 2015-10-03 DIAGNOSIS — Z9981 Dependence on supplemental oxygen: Secondary | ICD-10-CM | POA: Diagnosis not present

## 2015-10-03 DIAGNOSIS — I509 Heart failure, unspecified: Secondary | ICD-10-CM | POA: Diagnosis not present

## 2015-10-03 DIAGNOSIS — I5021 Acute systolic (congestive) heart failure: Secondary | ICD-10-CM | POA: Diagnosis not present

## 2015-10-03 DIAGNOSIS — E119 Type 2 diabetes mellitus without complications: Secondary | ICD-10-CM | POA: Diagnosis not present

## 2015-10-03 DIAGNOSIS — E1165 Type 2 diabetes mellitus with hyperglycemia: Secondary | ICD-10-CM | POA: Diagnosis not present

## 2015-10-06 DIAGNOSIS — Z9981 Dependence on supplemental oxygen: Secondary | ICD-10-CM | POA: Diagnosis not present

## 2015-10-06 DIAGNOSIS — I2510 Atherosclerotic heart disease of native coronary artery without angina pectoris: Secondary | ICD-10-CM | POA: Diagnosis not present

## 2015-10-06 DIAGNOSIS — E785 Hyperlipidemia, unspecified: Secondary | ICD-10-CM | POA: Diagnosis not present

## 2015-10-06 DIAGNOSIS — I5021 Acute systolic (congestive) heart failure: Secondary | ICD-10-CM | POA: Diagnosis not present

## 2015-10-06 DIAGNOSIS — E119 Type 2 diabetes mellitus without complications: Secondary | ICD-10-CM | POA: Diagnosis not present

## 2015-10-07 DIAGNOSIS — E119 Type 2 diabetes mellitus without complications: Secondary | ICD-10-CM | POA: Diagnosis not present

## 2015-10-07 DIAGNOSIS — Z9981 Dependence on supplemental oxygen: Secondary | ICD-10-CM | POA: Diagnosis not present

## 2015-10-07 DIAGNOSIS — I2510 Atherosclerotic heart disease of native coronary artery without angina pectoris: Secondary | ICD-10-CM | POA: Diagnosis not present

## 2015-10-07 DIAGNOSIS — E785 Hyperlipidemia, unspecified: Secondary | ICD-10-CM | POA: Diagnosis not present

## 2015-10-07 DIAGNOSIS — I5021 Acute systolic (congestive) heart failure: Secondary | ICD-10-CM | POA: Diagnosis not present

## 2015-10-09 DIAGNOSIS — I5021 Acute systolic (congestive) heart failure: Secondary | ICD-10-CM | POA: Diagnosis not present

## 2015-10-09 DIAGNOSIS — E785 Hyperlipidemia, unspecified: Secondary | ICD-10-CM | POA: Diagnosis not present

## 2015-10-09 DIAGNOSIS — E119 Type 2 diabetes mellitus without complications: Secondary | ICD-10-CM | POA: Diagnosis not present

## 2015-10-09 DIAGNOSIS — I2510 Atherosclerotic heart disease of native coronary artery without angina pectoris: Secondary | ICD-10-CM | POA: Diagnosis not present

## 2015-10-09 DIAGNOSIS — Z79899 Other long term (current) drug therapy: Secondary | ICD-10-CM | POA: Diagnosis not present

## 2015-10-09 DIAGNOSIS — Z9981 Dependence on supplemental oxygen: Secondary | ICD-10-CM | POA: Diagnosis not present

## 2015-10-10 DIAGNOSIS — E1165 Type 2 diabetes mellitus with hyperglycemia: Secondary | ICD-10-CM | POA: Diagnosis not present

## 2015-10-10 DIAGNOSIS — E1129 Type 2 diabetes mellitus with other diabetic kidney complication: Secondary | ICD-10-CM | POA: Diagnosis not present

## 2015-10-10 DIAGNOSIS — I509 Heart failure, unspecified: Secondary | ICD-10-CM | POA: Diagnosis not present

## 2015-10-10 DIAGNOSIS — Z87891 Personal history of nicotine dependence: Secondary | ICD-10-CM | POA: Diagnosis not present

## 2015-10-13 DIAGNOSIS — I2510 Atherosclerotic heart disease of native coronary artery without angina pectoris: Secondary | ICD-10-CM | POA: Diagnosis not present

## 2015-10-13 DIAGNOSIS — I5021 Acute systolic (congestive) heart failure: Secondary | ICD-10-CM | POA: Diagnosis not present

## 2015-10-13 DIAGNOSIS — E785 Hyperlipidemia, unspecified: Secondary | ICD-10-CM | POA: Diagnosis not present

## 2015-10-13 DIAGNOSIS — E119 Type 2 diabetes mellitus without complications: Secondary | ICD-10-CM | POA: Diagnosis not present

## 2015-10-13 DIAGNOSIS — Z9981 Dependence on supplemental oxygen: Secondary | ICD-10-CM | POA: Diagnosis not present

## 2015-10-15 DIAGNOSIS — E119 Type 2 diabetes mellitus without complications: Secondary | ICD-10-CM | POA: Diagnosis not present

## 2015-10-15 DIAGNOSIS — Z9981 Dependence on supplemental oxygen: Secondary | ICD-10-CM | POA: Diagnosis not present

## 2015-10-15 DIAGNOSIS — E785 Hyperlipidemia, unspecified: Secondary | ICD-10-CM | POA: Diagnosis not present

## 2015-10-15 DIAGNOSIS — Z79899 Other long term (current) drug therapy: Secondary | ICD-10-CM | POA: Diagnosis not present

## 2015-10-15 DIAGNOSIS — I2510 Atherosclerotic heart disease of native coronary artery without angina pectoris: Secondary | ICD-10-CM | POA: Diagnosis not present

## 2015-10-15 DIAGNOSIS — I5021 Acute systolic (congestive) heart failure: Secondary | ICD-10-CM | POA: Diagnosis not present

## 2015-10-16 DIAGNOSIS — E785 Hyperlipidemia, unspecified: Secondary | ICD-10-CM | POA: Diagnosis not present

## 2015-10-16 DIAGNOSIS — Z9981 Dependence on supplemental oxygen: Secondary | ICD-10-CM | POA: Diagnosis not present

## 2015-10-16 DIAGNOSIS — I5021 Acute systolic (congestive) heart failure: Secondary | ICD-10-CM | POA: Diagnosis not present

## 2015-10-16 DIAGNOSIS — I2510 Atherosclerotic heart disease of native coronary artery without angina pectoris: Secondary | ICD-10-CM | POA: Diagnosis not present

## 2015-10-16 DIAGNOSIS — E119 Type 2 diabetes mellitus without complications: Secondary | ICD-10-CM | POA: Diagnosis not present

## 2015-10-29 DIAGNOSIS — E785 Hyperlipidemia, unspecified: Secondary | ICD-10-CM | POA: Diagnosis not present

## 2015-10-29 DIAGNOSIS — E119 Type 2 diabetes mellitus without complications: Secondary | ICD-10-CM | POA: Diagnosis not present

## 2015-10-29 DIAGNOSIS — I5021 Acute systolic (congestive) heart failure: Secondary | ICD-10-CM | POA: Diagnosis not present

## 2015-10-29 DIAGNOSIS — I2510 Atherosclerotic heart disease of native coronary artery without angina pectoris: Secondary | ICD-10-CM | POA: Diagnosis not present

## 2015-10-29 DIAGNOSIS — Z9981 Dependence on supplemental oxygen: Secondary | ICD-10-CM | POA: Diagnosis not present

## 2015-10-31 DIAGNOSIS — Z79899 Other long term (current) drug therapy: Secondary | ICD-10-CM | POA: Diagnosis not present

## 2015-11-07 DIAGNOSIS — I10 Essential (primary) hypertension: Secondary | ICD-10-CM | POA: Diagnosis not present

## 2015-11-07 DIAGNOSIS — N183 Chronic kidney disease, stage 3 (moderate): Secondary | ICD-10-CM | POA: Diagnosis not present

## 2015-11-07 DIAGNOSIS — E1129 Type 2 diabetes mellitus with other diabetic kidney complication: Secondary | ICD-10-CM | POA: Diagnosis not present

## 2015-11-07 DIAGNOSIS — E1165 Type 2 diabetes mellitus with hyperglycemia: Secondary | ICD-10-CM | POA: Diagnosis not present

## 2015-11-07 DIAGNOSIS — Z6825 Body mass index (BMI) 25.0-25.9, adult: Secondary | ICD-10-CM | POA: Diagnosis not present

## 2015-11-07 DIAGNOSIS — M25552 Pain in left hip: Secondary | ICD-10-CM | POA: Diagnosis not present

## 2015-11-07 DIAGNOSIS — Z299 Encounter for prophylactic measures, unspecified: Secondary | ICD-10-CM | POA: Diagnosis not present

## 2015-11-18 DIAGNOSIS — M25552 Pain in left hip: Secondary | ICD-10-CM | POA: Diagnosis not present

## 2015-12-19 DIAGNOSIS — E1122 Type 2 diabetes mellitus with diabetic chronic kidney disease: Secondary | ICD-10-CM | POA: Diagnosis not present

## 2015-12-19 DIAGNOSIS — Z299 Encounter for prophylactic measures, unspecified: Secondary | ICD-10-CM | POA: Diagnosis not present

## 2015-12-19 DIAGNOSIS — I509 Heart failure, unspecified: Secondary | ICD-10-CM | POA: Diagnosis not present

## 2015-12-19 DIAGNOSIS — N184 Chronic kidney disease, stage 4 (severe): Secondary | ICD-10-CM | POA: Diagnosis not present

## 2015-12-23 ENCOUNTER — Other Ambulatory Visit: Payer: Self-pay | Admitting: Cardiology

## 2016-01-16 DIAGNOSIS — I509 Heart failure, unspecified: Secondary | ICD-10-CM | POA: Diagnosis not present

## 2016-01-16 DIAGNOSIS — E1122 Type 2 diabetes mellitus with diabetic chronic kidney disease: Secondary | ICD-10-CM | POA: Diagnosis not present

## 2016-01-16 DIAGNOSIS — N184 Chronic kidney disease, stage 4 (severe): Secondary | ICD-10-CM | POA: Diagnosis not present

## 2016-01-16 DIAGNOSIS — Z299 Encounter for prophylactic measures, unspecified: Secondary | ICD-10-CM | POA: Diagnosis not present

## 2016-01-16 DIAGNOSIS — M25552 Pain in left hip: Secondary | ICD-10-CM | POA: Diagnosis not present

## 2016-01-27 ENCOUNTER — Other Ambulatory Visit: Payer: Self-pay | Admitting: Cardiology

## 2016-02-25 ENCOUNTER — Other Ambulatory Visit: Payer: Self-pay | Admitting: Cardiology

## 2016-03-12 ENCOUNTER — Encounter: Payer: Self-pay | Admitting: *Deleted

## 2016-03-14 NOTE — Progress Notes (Signed)
Cardiology Office Note  Date: 03/15/2016   ID: KYUSS HALE, DOB [birth year], MRN [id number]  PCP: Glenda Chroman, MD  Primary Cardiologist: Rozann Lesches, MD   Chief Complaint  Patient presents with  . Coronary Artery Disease    History of Present Illness: John Moore is an 82 y.o. male last seen in January. He presents with his daughter for a follow-up visit today. Reports no major change in health since I saw him. No angina or nitroglycerin use. He has a very old bottle and we discussed getting a refill.  I went over his cardiac medications. He continues on aspirin, Coreg, Norvasc, Lasix, hydralazine, Benicar, potassium supplements, Zocor and as needed nitroglycerin.  We continue conservative management of his CAD and cardiomyopathy. He has remained clinically stable.  Past Medical History:  Diagnosis Date  . ACE inhibitor intolerance    Tolerates ARB  . Allergic reaction to contrast dye   . CAD (coronary artery disease)    Multivessel status post CABG 2008  . Chronic systolic heart failure ([place])   . CKD (chronic kidney disease) stage 3, GFR 30-59 ml/min   . Diverticulitis   . Dyslipidemia   . Dysphagia   . Essential hypertension   . GERD (gastroesophageal reflux disease)   . GI bleeding 2002  . Ischemic cardiomyopathy   . Leg cramps    Nocturnal  . Postoperative atrial fibrillation ([place])   . Prostate disorder   . Type 2 diabetes mellitus ([place])     Past Surgical History:  Procedure Laterality Date  . COLONOSCOPY  04/2001  . CORONARY ARTERY BYPASS GRAFT  05/2007   LIMA to LAD, SVG to diagonal, SVG to OM, SVG to RCA  . ESOPHAGOGASTRODUODENOSCOPY N/A 06/27/2014   Procedure: ESOPHAGOGASTRODUODENOSCOPY (EGD);  Surgeon: Rogene Houston, MD;  [facility];  Service: Endoscopy;  Laterality: N/A;  830  . MALONEY DILATION N/A 06/27/2014   Procedure: Venia Minks DILATION;  Surgeon: Rogene Houston, MD;  [facility];  Service: Endoscopy;   Laterality: N/A;  . MYRINFOPLASTY    . ROTATOR CUFF REPAIR    . TONSILLECTOMY      Current Outpatient Prescriptions  Medication Sig Dispense Refill  . amLODipine (NORVASC) 10 MG tablet TAKE 1 TABLET BY MOUTH DAILY 90 tablet 3  . aspirin EC 81 MG tablet Take 81 mg by mouth daily.    . carvedilol (COREG) 25 MG tablet TAKE 1/2 TABLET BY MOUTH TWICE DAILY - MUST SCHEDULE APPOINTMENT WITH DR. Jazari Ober FOR FURTHER REFILLS 30 tablet 3  . cholecalciferol (VITAMIN D) 400 units TABS tablet Take 400 Units by mouth daily.     . furosemide (LASIX) 40 MG tablet Take 1 tablet by mouth 2 (two) times daily.    Marland Kitchen glipiZIDE (GLUCOTROL XL) 5 MG 24 hr tablet Take 10 tablets by mouth daily with breakfast. & 1 tablet in the evening (2 tab AM 1 tab PM)    . hydrALAZINE (APRESOLINE) 25 MG tablet Take 1 tablet by mouth 2 (two) times daily.    . Magnesium Oxide 250 MG TABS Take 1 tablet (250 mg total) by mouth daily. 30 each 0  . metFORMIN (GLUCOPHAGE) 500 MG tablet Take 1,000 mg by mouth daily with breakfast. & 1 in the evening (Two in am and one in pm)    . Multiple Vitamin (MULTIVITAMIN) tablet Take 1 tablet by mouth daily.    . nitroGLYCERIN (NITROSTAT) 0.4 MG SL tablet Place 1 tablet (0.4 mg  total) under the tongue every 5 (five) minutes as needed. For chest pains 25 tablet 3  . olmesartan (BENICAR) 40 MG tablet Take 1 tablet (40 mg total) by mouth daily. 90 tablet 3  . pantoprazole (PROTONIX) 40 MG tablet TAKE 1 TABLET BY MOUTH DAILY. 90 tablet 4  . potassium chloride (MICRO-K) 10 MEQ CR capsule Take 1 mEq by mouth daily.    . simvastatin (ZOCOR) 20 MG tablet Take 1 tablet (20 mg total) by mouth at bedtime. 90 tablet 3   No current facility-administered medications for this visit.    Allergies:  Ace inhibitors; Nsaids; and Iodinated diagnostic agents   Social History: The patient  reports that he quit smoking about 37 years ago. His smoking use included Cigarettes. He started smoking about 67 years ago. He  has a 9.00 pack-year smoking history. He has never used smokeless tobacco. He reports that he does not drink alcohol or use drugs.   ROS:  Please see the history of present illness. Otherwise, complete review of systems is positive for decreased hearing, arthritic stiffness.  All other systems are reviewed and negative.   Physical Exam: VS:  BP 135/62   Pulse (!) 56   Ht 5\' 8"  (1.727 m)   Wt 171 lb (77.6 kg)   BMI 26.00 kg/m , BMI Body mass index is 26 kg/m.  Wt Readings from Last 3 Encounters:  03/15/16 171 lb (77.6 kg)  06/23/15 162 lb 4.8 oz (73.6 kg)  06/13/15 166 lb 6.4 oz (75.5 kg)    General: Elderly male, appears comfortable at rest. HEENT: Conjunctiva and lids normal, oropharynx clear. Neck: Supple, no elevated JVP or carotid bruits, no thyromegaly. Lungs: Clear to auscultation, nonlabored breathing at rest. Cardiac: RRR with frequent ventricular ectopy, no S3, soft systolic murmur, no pericardial rub. Abdomen: Soft, nontender, bowel sounds present, no guarding or rebound. Extremities: Leg edema resolved, distal pulses 2+.  ECG: I personally reviewed the tracing from 05/13/2015 which showed sinus rhythm with ventricular bigeminy, LVH, left axis.  Recent Labwork:  January 2016: BUN 23, creatinine 1.6, potassium 5.1, magnesium 1.7  Other Studies Reviewed Today:  Echocardiogram 01/08/2010 [facility]) reported mildly dilated LV with LVEF 20-25%, restrictive diastolic filling pattern with increased filling pressures, moderate left atrial enlargement, mildly reduced RV contraction, mild right atrial enlargement, sclerotic aortic valve, mild to moderate mitral regurgitation , mild tricuspid regurgitation, severe pulmonary hypertension with RVSP 63 mmHg.  Assessment and Plan:  1. Ischemic cardiomyopathy with LVEF 20-25%. He has done well since last visit, leg edema resolved and clinically stable on present regimen. Plan is to continue with conservative management, has been the case  over the years, previously followed with Dr. Ron Parker. He is not reporting any angina symptoms. Refill provided for nitroglycerin.  2. Hyperlipidemia, continues on Zocor. Follow-up with Dr. Woody Seller is pending soon.  Current medicines were reviewed with the patient today.   Disposition: Follow-up with me in 6 months.  Signed, Satira Sark, MD, [facility] 03/15/2016 4:18 PM    [facility] at [place], [place], [place] [address] Phone: [phone]; Fax: [phone]

## 2016-03-15 ENCOUNTER — Ambulatory Visit (INDEPENDENT_AMBULATORY_CARE_PROVIDER_SITE_OTHER): Payer: Medicare Other | Admitting: Cardiology

## 2016-03-15 ENCOUNTER — Encounter: Payer: Self-pay | Admitting: Cardiology

## 2016-03-15 VITALS — BP 135/62 | HR 56 | Ht 68.0 in | Wt 171.0 lb

## 2016-03-15 DIAGNOSIS — Z23 Encounter for immunization: Secondary | ICD-10-CM | POA: Diagnosis not present

## 2016-03-15 DIAGNOSIS — I255 Ischemic cardiomyopathy: Secondary | ICD-10-CM | POA: Diagnosis not present

## 2016-03-15 DIAGNOSIS — E782 Mixed hyperlipidemia: Secondary | ICD-10-CM

## 2016-03-15 DIAGNOSIS — I2589 Other forms of chronic ischemic heart disease: Secondary | ICD-10-CM

## 2016-03-15 MED ORDER — NITROGLYCERIN 0.4 MG SL SUBL: 0.4000 mg | SUBLINGUAL_TABLET | SUBLINGUAL | 3 refills | Status: AC | PRN

## 2016-03-15 NOTE — Patient Instructions (Signed)

## 2016-04-09 DIAGNOSIS — Z7189 Other specified counseling: Secondary | ICD-10-CM | POA: Diagnosis not present

## 2016-04-09 DIAGNOSIS — Z1211 Encounter for screening for malignant neoplasm of colon: Secondary | ICD-10-CM | POA: Diagnosis not present

## 2016-04-09 DIAGNOSIS — Z1389 Encounter for screening for other disorder: Secondary | ICD-10-CM | POA: Diagnosis not present

## 2016-04-09 DIAGNOSIS — Z299 Encounter for prophylactic measures, unspecified: Secondary | ICD-10-CM | POA: Diagnosis not present

## 2016-04-09 DIAGNOSIS — Z0000 Encounter for general adult medical examination without abnormal findings: Secondary | ICD-10-CM | POA: Diagnosis not present

## 2016-04-12 DIAGNOSIS — E7800 Pure hypercholesterolemia, unspecified: Secondary | ICD-10-CM | POA: Diagnosis not present

## 2016-04-12 DIAGNOSIS — Z125 Encounter for screening for malignant neoplasm of prostate: Secondary | ICD-10-CM | POA: Diagnosis not present

## 2016-04-12 DIAGNOSIS — R5383 Other fatigue: Secondary | ICD-10-CM | POA: Diagnosis not present

## 2016-04-12 DIAGNOSIS — Z79899 Other long term (current) drug therapy: Secondary | ICD-10-CM | POA: Diagnosis not present

## 2016-04-15 DIAGNOSIS — L570 Actinic keratosis: Secondary | ICD-10-CM | POA: Diagnosis not present

## 2016-05-04 ENCOUNTER — Encounter (INDEPENDENT_AMBULATORY_CARE_PROVIDER_SITE_OTHER): Payer: Self-pay | Admitting: Internal Medicine

## 2016-05-04 DIAGNOSIS — I509 Heart failure, unspecified: Secondary | ICD-10-CM | POA: Diagnosis not present

## 2016-05-04 DIAGNOSIS — E1122 Type 2 diabetes mellitus with diabetic chronic kidney disease: Secondary | ICD-10-CM | POA: Diagnosis not present

## 2016-05-04 DIAGNOSIS — N184 Chronic kidney disease, stage 4 (severe): Secondary | ICD-10-CM | POA: Diagnosis not present

## 2016-05-04 DIAGNOSIS — Z299 Encounter for prophylactic measures, unspecified: Secondary | ICD-10-CM | POA: Diagnosis not present

## 2016-05-14 DIAGNOSIS — I509 Heart failure, unspecified: Secondary | ICD-10-CM | POA: Diagnosis not present

## 2016-05-14 DIAGNOSIS — E119 Type 2 diabetes mellitus without complications: Secondary | ICD-10-CM | POA: Diagnosis not present

## 2016-05-14 DIAGNOSIS — E7800 Pure hypercholesterolemia, unspecified: Secondary | ICD-10-CM | POA: Diagnosis not present

## 2016-06-21 ENCOUNTER — Other Ambulatory Visit: Payer: Self-pay | Admitting: Cardiology

## 2016-06-22 ENCOUNTER — Encounter (INDEPENDENT_AMBULATORY_CARE_PROVIDER_SITE_OTHER): Payer: Self-pay | Admitting: Internal Medicine

## 2016-06-22 ENCOUNTER — Ambulatory Visit (INDEPENDENT_AMBULATORY_CARE_PROVIDER_SITE_OTHER): Payer: Medicare Other | Admitting: Internal Medicine

## 2016-06-22 ENCOUNTER — Encounter (INDEPENDENT_AMBULATORY_CARE_PROVIDER_SITE_OTHER): Payer: Self-pay

## 2016-06-22 VITALS — BP 156/70 | HR 64 | Temp 97.6°F | Ht 68.0 in | Wt 172.4 lb

## 2016-06-22 DIAGNOSIS — R1319 Other dysphagia: Secondary | ICD-10-CM

## 2016-06-22 DIAGNOSIS — R1310 Dysphagia, unspecified: Secondary | ICD-10-CM

## 2016-06-22 NOTE — Progress Notes (Signed)
Subjective:    Patient ID: John Moore, male    DOB: 25-Nov-1927, 83 y.o.   MRN: [id number]  HPI Here today for f/u of his dysphagia. He was last seen in January, 2017  for same. Last EGD/ED in January of 2016 which revealed ring like stricture at GE junction dilated by passing 56 [place] Maloney dilator. Small hiatal hernia. He tells me he is getting up and moving around. He says his dysphagia is okay. Sometimes he has some tightness in his esophagus. He is not ready for an ED.   He has gained 10 pounds since his last visit.  Continues to drive. Appetite is good.     06/27/2014: EGD with ED  Indications: Patient is 83 year old Caucasian male was chronic GERD complicated by distal esophageal stricture was dilated in March 2013 and he now presents with intermittent solid food dysphagia. Heartburn is well controlled with therapy.  Impression: Ringlike stricture at GE junction dilated by passing 68 [place] Maloney dilator. Small sliding hiatal hernia.   Procedure Date: 08/26/2011 Procedure: EGD with ED. Indications: Patient is an 83 year old Caucasian male who underwent EGD back in October 2012 have ulcerative esophagitis with a high-grade stricture which was dilated to 16.5 mm with a balloon. Patient was advised to return for office visit but he did not. He felt so much better he decided to cut his medication. However now he has developed intermittent solid food dysphagia. He is therefore undergoing repeat dilation. Impression: Healed esophagitis. Stricture at GE junction and dilated with a balloon to 18 mm. Small sliding hiatal hernia.  Review of Systems Past Medical History:  Diagnosis Date  . ACE inhibitor intolerance    Tolerates ARB  .  Allergic reaction to contrast dye   . CAD (coronary artery disease)    Multivessel status post CABG 2008  . Chronic systolic heart failure ([place])   . CKD (chronic kidney disease) stage 3, GFR 30-59 ml/min   . Diverticulitis   . Dyslipidemia   . Dysphagia   . Essential hypertension   . GERD (gastroesophageal reflux disease)   . GI bleeding 2002  . Ischemic cardiomyopathy   . Leg cramps    Nocturnal  . Postoperative atrial fibrillation ([place])   . Prostate disorder   . Type 2 diabetes mellitus ([place])     Past Surgical History:  Procedure Laterality Date  . COLONOSCOPY  04/2001  . CORONARY ARTERY BYPASS GRAFT  05/2007   LIMA to LAD, SVG to diagonal, SVG to OM, SVG to RCA  . ESOPHAGOGASTRODUODENOSCOPY N/A 06/27/2014   Procedure: ESOPHAGOGASTRODUODENOSCOPY (EGD);  Surgeon: Rogene Houston, MD;  [facility];  Service: Endoscopy;  Laterality: N/A;  830  . MALONEY DILATION N/A 06/27/2014   Procedure: Venia Minks DILATION;  Surgeon: Rogene Houston, MD;  [facility];  Service: Endoscopy;  Laterality: N/A;  . MYRINFOPLASTY    . ROTATOR CUFF REPAIR    . TONSILLECTOMY      Allergies  Allergen Reactions  . Ace Inhibitors Other (See Comments)    unknown  . Nsaids Other (See Comments)    unknown  . Iodinated Diagnostic Agents Rash    Current Outpatient Prescriptions on File Prior to Visit  Medication Sig Dispense Refill  . amLODipine (NORVASC) 10 MG tablet TAKE 1 TABLET BY MOUTH DAILY 90 tablet 3  . aspirin EC 81 MG tablet Take 81 mg by mouth daily.    . carvedilol (COREG) 25 MG tablet TAKE 1/2 TABLET BY MOUTH  TWICE DAILY 30 tablet 6  . cholecalciferol (VITAMIN D) 400 units TABS tablet Take 400 Units by mouth daily.     . furosemide (LASIX) 40 MG tablet Take 1 tablet by mouth 2 (two) times daily.    Marland Kitchen glipiZIDE (GLUCOTROL XL) 5 MG 24 hr tablet Take 10 tablets by mouth daily with breakfast. & 1 tablet in the evening (2 tab AM 1 tab PM)    . hydrALAZINE (APRESOLINE) 25  MG tablet Take 1 tablet by mouth 2 (two) times daily.    . Magnesium Oxide 250 MG TABS Take 1 tablet (250 mg total) by mouth daily. 30 each 0  . Multiple Vitamin (MULTIVITAMIN) tablet Take 1 tablet by mouth daily.    . nitroGLYCERIN (NITROSTAT) 0.4 MG SL tablet Place 1 tablet (0.4 mg total) under the tongue every 5 (five) minutes as needed. For chest pains 25 tablet 3  . olmesartan (BENICAR) 40 MG tablet Take 1 tablet (40 mg total) by mouth daily. 90 tablet 3  . pantoprazole (PROTONIX) 40 MG tablet TAKE 1 TABLET BY MOUTH DAILY. 90 tablet 4  . potassium chloride (MICRO-K) 10 MEQ CR capsule Take 1 mEq by mouth daily.    . simvastatin (ZOCOR) 20 MG tablet Take 1 tablet (20 mg total) by mouth at bedtime. 90 tablet 3   No current facility-administered medications on file prior to visit.        Objective:   Physical Exam Blood pressure (!) 156/70, pulse 64, temperature 97.6 F (36.4 C), height 5\' 8"  (1.727 m), weight 172 lb 6.4 oz (78.2 kg). Alert and oriented. Skin warm and dry. Oral mucosa is moist.   . Sclera anicteric, conjunctivae is pink. Thyroid not enlarged. No cervical lymphadenopathy. Lungs clear. Heart regular rate and rhythm.  Abdomen is soft. Bowel sounds are positive. No hepatomegaly. No abdominal masses felt. No tenderness.  No edema to lower extremities.          Assessment & Plan:  Dysphagia. He is doing weill. No GI complaints. He will have OV in 1 year. Continue the Protonix.

## 2016-06-22 NOTE — Patient Instructions (Signed)
OV in 1 year.  

## 2016-07-01 DIAGNOSIS — E7800 Pure hypercholesterolemia, unspecified: Secondary | ICD-10-CM | POA: Diagnosis not present

## 2016-07-01 DIAGNOSIS — E119 Type 2 diabetes mellitus without complications: Secondary | ICD-10-CM | POA: Diagnosis not present

## 2016-07-01 DIAGNOSIS — I509 Heart failure, unspecified: Secondary | ICD-10-CM | POA: Diagnosis not present

## 2016-07-08 DIAGNOSIS — E7800 Pure hypercholesterolemia, unspecified: Secondary | ICD-10-CM | POA: Diagnosis not present

## 2016-07-08 DIAGNOSIS — E119 Type 2 diabetes mellitus without complications: Secondary | ICD-10-CM | POA: Diagnosis not present

## 2016-07-08 DIAGNOSIS — I509 Heart failure, unspecified: Secondary | ICD-10-CM | POA: Diagnosis not present

## 2016-08-05 DIAGNOSIS — E7800 Pure hypercholesterolemia, unspecified: Secondary | ICD-10-CM | POA: Diagnosis not present

## 2016-08-05 DIAGNOSIS — I509 Heart failure, unspecified: Secondary | ICD-10-CM | POA: Diagnosis not present

## 2016-08-05 DIAGNOSIS — E119 Type 2 diabetes mellitus without complications: Secondary | ICD-10-CM | POA: Diagnosis not present

## 2016-08-19 DIAGNOSIS — E11319 Type 2 diabetes mellitus with unspecified diabetic retinopathy without macular edema: Secondary | ICD-10-CM | POA: Diagnosis not present

## 2016-08-20 DIAGNOSIS — I10 Essential (primary) hypertension: Secondary | ICD-10-CM | POA: Diagnosis not present

## 2016-08-20 DIAGNOSIS — I509 Heart failure, unspecified: Secondary | ICD-10-CM | POA: Diagnosis not present

## 2016-08-20 DIAGNOSIS — I2510 Atherosclerotic heart disease of native coronary artery without angina pectoris: Secondary | ICD-10-CM | POA: Diagnosis not present

## 2016-08-20 DIAGNOSIS — E7800 Pure hypercholesterolemia, unspecified: Secondary | ICD-10-CM | POA: Diagnosis not present

## 2016-08-20 DIAGNOSIS — N184 Chronic kidney disease, stage 4 (severe): Secondary | ICD-10-CM | POA: Diagnosis not present

## 2016-08-20 DIAGNOSIS — N183 Chronic kidney disease, stage 3 (moderate): Secondary | ICD-10-CM | POA: Diagnosis not present

## 2016-08-20 DIAGNOSIS — E1122 Type 2 diabetes mellitus with diabetic chronic kidney disease: Secondary | ICD-10-CM | POA: Diagnosis not present

## 2016-08-20 DIAGNOSIS — Z299 Encounter for prophylactic measures, unspecified: Secondary | ICD-10-CM | POA: Diagnosis not present

## 2016-08-20 DIAGNOSIS — R591 Generalized enlarged lymph nodes: Secondary | ICD-10-CM | POA: Diagnosis not present

## 2016-08-20 DIAGNOSIS — M47817 Spondylosis without myelopathy or radiculopathy, lumbosacral region: Secondary | ICD-10-CM | POA: Diagnosis not present

## 2016-08-20 DIAGNOSIS — Z713 Dietary counseling and surveillance: Secondary | ICD-10-CM | POA: Diagnosis not present

## 2016-08-20 DIAGNOSIS — N400 Enlarged prostate without lower urinary tract symptoms: Secondary | ICD-10-CM | POA: Diagnosis not present

## 2016-09-09 DIAGNOSIS — I509 Heart failure, unspecified: Secondary | ICD-10-CM | POA: Diagnosis not present

## 2016-09-09 DIAGNOSIS — E7800 Pure hypercholesterolemia, unspecified: Secondary | ICD-10-CM | POA: Diagnosis not present

## 2016-09-09 DIAGNOSIS — E119 Type 2 diabetes mellitus without complications: Secondary | ICD-10-CM | POA: Diagnosis not present

## 2016-09-16 DIAGNOSIS — L570 Actinic keratosis: Secondary | ICD-10-CM | POA: Diagnosis not present

## 2016-09-16 DIAGNOSIS — L814 Other melanin hyperpigmentation: Secondary | ICD-10-CM | POA: Diagnosis not present

## 2016-09-16 DIAGNOSIS — L821 Other seborrheic keratosis: Secondary | ICD-10-CM | POA: Diagnosis not present

## 2016-09-20 ENCOUNTER — Ambulatory Visit (INDEPENDENT_AMBULATORY_CARE_PROVIDER_SITE_OTHER): Payer: Medicare Other | Admitting: Cardiology

## 2016-09-20 ENCOUNTER — Other Ambulatory Visit (INDEPENDENT_AMBULATORY_CARE_PROVIDER_SITE_OTHER): Payer: Self-pay | Admitting: Internal Medicine

## 2016-09-20 ENCOUNTER — Other Ambulatory Visit: Payer: Self-pay | Admitting: Cardiology

## 2016-09-20 ENCOUNTER — Encounter: Payer: Self-pay | Admitting: Cardiology

## 2016-09-20 VITALS — BP 148/72 | HR 69 | Ht 68.0 in | Wt 176.2 lb

## 2016-09-20 DIAGNOSIS — I255 Ischemic cardiomyopathy: Secondary | ICD-10-CM

## 2016-09-20 DIAGNOSIS — E782 Mixed hyperlipidemia: Secondary | ICD-10-CM | POA: Diagnosis not present

## 2016-09-20 DIAGNOSIS — I2510 Atherosclerotic heart disease of native coronary artery without angina pectoris: Secondary | ICD-10-CM | POA: Diagnosis not present

## 2016-09-20 DIAGNOSIS — N183 Chronic kidney disease, stage 3 unspecified: Secondary | ICD-10-CM

## 2016-09-20 NOTE — Progress Notes (Signed)
Cardiology Office Note  Date: 09/20/2016   ID: John Moore, DOB Jun 10, 1927, MRN [id number]  PCP: Glenda Chroman, MD  Primary Cardiologist: Rozann Lesches, MD   Chief Complaint  Patient presents with  . Ischemic cardiomyopathy    History of Present Illness: John Moore is an 83 y.o. male last seen in October 2017. He is here today with his daughter for a follow-up visit. Overall continues to do fairly well. He reports NYHA class II dyspnea, no exertional chest pain. He does basic ADLs, does not overexert himself. He has had no falls or syncope, no hospitalizations since I last saw him.  We continue conservative management of his CAD and cardiomyopathy. Current regimen includes aspirin, Coreg, Norvasc, Lasix, hydralazine, Benicar, potassium supplements, and Zocor.  I personally reviewed his ECG today which shows sinus rhythm with prolonged PR interval, frequent ventricular ectopic beats, IVCD with poor R-wave progression.  Past Medical History:  Diagnosis Date  . ACE inhibitor intolerance    Tolerates ARB  . Allergic reaction to contrast dye   . CAD (coronary artery disease)    Multivessel status post CABG 2008  . Chronic systolic heart failure ([place])   . CKD (chronic kidney disease) stage 3, GFR 30-59 ml/min   . Diverticulitis   . Dyslipidemia   . Dysphagia   . Essential hypertension   . GERD (gastroesophageal reflux disease)   . GI bleeding 2002  . Ischemic cardiomyopathy   . Leg cramps    Nocturnal  . Postoperative atrial fibrillation ([place])   . Prostate disorder   . Type 2 diabetes mellitus ([place])     Past Surgical History:  Procedure Laterality Date  . COLONOSCOPY  04/2001  . CORONARY ARTERY BYPASS GRAFT  05/2007   LIMA to LAD, SVG to diagonal, SVG to OM, SVG to RCA  . ESOPHAGOGASTRODUODENOSCOPY N/A 06/27/2014   Procedure: ESOPHAGOGASTRODUODENOSCOPY (EGD);  Surgeon: Rogene Houston, MD;  [facility];  Service: Endoscopy;  Laterality: N/A;  830  .  MALONEY DILATION N/A 06/27/2014   Procedure: Venia Minks DILATION;  Surgeon: Rogene Houston, MD;  [facility];  Service: Endoscopy;  Laterality: N/A;  . MYRINFOPLASTY    . ROTATOR CUFF REPAIR    . TONSILLECTOMY      Current Outpatient Prescriptions  Medication Sig Dispense Refill  . amLODipine (NORVASC) 10 MG tablet TAKE 1 TABLET BY MOUTH DAILY 90 tablet 3  . aspirin EC 81 MG tablet Take 81 mg by mouth daily.    . carvedilol (COREG) 25 MG tablet TAKE 1/2 TABLET BY MOUTH TWICE DAILY 30 tablet 6  . Cholecalciferol (VITAMIN D3 PO) Take 1 tablet by mouth daily.    . furosemide (LASIX) 40 MG tablet Take 1 tablet by mouth 2 (two) times daily.    . hydrALAZINE (APRESOLINE) 25 MG tablet Take 1 tablet by mouth 2 (two) times daily.    Marland Kitchen linagliptin (TRADJENTA) 5 MG TABS tablet Take 5 mg by mouth daily.    . magnesium oxide (MAG-OX) 400 MG tablet Take 400 mg by mouth daily.    . Multiple Vitamin (MULTIVITAMIN) tablet Take 1 tablet by mouth daily.    . nitroGLYCERIN (NITROSTAT) 0.4 MG SL tablet Place 1 tablet (0.4 mg total) under the tongue every 5 (five) minutes as needed. For chest pains 25 tablet 3  . olmesartan (BENICAR) 40 MG tablet Take 1 tablet (40 mg total) by mouth daily. 90 tablet 3  . pantoprazole (PROTONIX) 40 MG  tablet TAKE 1 TABLET BY MOUTH DAILY. 90 tablet 4  . potassium chloride (MICRO-K) 10 MEQ CR capsule Take 10 mEq by mouth daily.     . simvastatin (ZOCOR) 20 MG tablet Take 1 tablet (20 mg total) by mouth at bedtime. 90 tablet 3   No current facility-administered medications for this visit.    Allergies:  Ace inhibitors; Nsaids; and Iodinated diagnostic agents   Social History: The patient  reports that he quit smoking about 38 years ago. His smoking use included Cigarettes. He started smoking about 68 years ago. He has a 9.00 pack-year smoking history. He has never used smokeless tobacco. He reports that he does not drink alcohol or use drugs.   ROS:  Please see the  history of present illness. Otherwise, complete review of systems is positive for hearing loss, recent removal of several skin lesions on his scalp and arms.  All other systems are reviewed and negative.   Physical Exam: VS:  BP (!) 148/72   Pulse 69   Ht 5\' 8"  (1.727 m)   Wt 176 lb 3.2 oz (79.9 kg)   SpO2 97%   BMI 26.79 kg/m , BMI Body mass index is 26.79 kg/m.  Wt Readings from Last 3 Encounters:  09/20/16 176 lb 3.2 oz (79.9 kg)  06/22/16 172 lb 6.4 oz (78.2 kg)  03/15/16 171 lb (77.6 kg)    General: Elderly male, appears comfortable at rest. HEENT: Conjunctiva and lids normal, oropharynx clear. Neck: Supple, no elevated JVP or carotid bruits, no thyromegaly. Lungs: Clear to auscultation, nonlabored breathing at rest. Cardiac: RRR with frequent ventricular ectopy, no S3, soft systolic murmur, no pericardial rub. Abdomen: Soft, nontender, bowel sounds present, no guarding or rebound. Extremities: No significant leg edema, distal pulses 2+. Skin: Warm and dry. Status post resection of recent skin lesions on scalp and arms. Musculoskeletal: Mild kyphosis. Neuropsychiatric: Alert and oriented 3, affect appropriate.  ECG: I personally reviewed the tracing from 05/13/2015 which showed sinus rhythm with ventricular bigeminy, LVH, left axis.  Recent Labwork:  January 2016: BUN 23, creatinine 1.6, potassium 5.1, magnesium 1.7  Other Studies Reviewed Today:  Echocardiogram 01/08/2010 [facility]) reported mildly dilated LV with LVEF 20-25%, restrictive diastolic filling pattern with increased filling pressures, moderate left atrial enlargement, mildly reduced RV contraction, mild right atrial enlargement, sclerotic aortic valve, mild to moderate mitral regurgitation , mild tricuspid regurgitation, severe pulmonary hypertension with RVSP 63 mmHg.  Assessment and Plan:  1. Conservatively managed ischemic myopathy with LVEF 20-25% based on prior assessment. He continues to do very well in  terms of symptom control, no interval hospitalizations noted in volume status looks to be adequately controlled. Plan to continue present regimen.  2. Hyperlipidemia, continues on Zocor without intolerances.  3. CKD stage 3, last creatinine 1.6. He continues to follow with Dr. Woody Seller.  4. Multivessel CAD status post CABG in 2008. No active angina symptoms. Continue antiplatelet regimen and statin.  Current medicines were reviewed with the patient today.   Orders Placed This Encounter  Procedures  . EKG 12-Lead    Disposition: Follow-up in 6 months.  Signed, Satira Sark, MD, [facility] 09/20/2016 2:58 PM    [facility] at [place], [place], [place] [address] Phone: [phone]; Fax: [phone]

## 2016-09-20 NOTE — Patient Instructions (Signed)

## 2016-09-24 ENCOUNTER — Telehealth: Payer: Self-pay | Admitting: *Deleted

## 2016-09-24 NOTE — Telephone Encounter (Signed)
Pt daughter called about recent appt and if pt needs to be taking magnesium - on his medication list for the last few office visits but pt hasn't been taking for some time

## 2016-09-25 NOTE — Telephone Encounter (Signed)
Yes, would stay on magnesium.

## 2016-09-27 NOTE — Telephone Encounter (Signed)
Pt daughter made aware and will update pt medications

## 2016-10-05 DIAGNOSIS — E7800 Pure hypercholesterolemia, unspecified: Secondary | ICD-10-CM | POA: Diagnosis not present

## 2016-10-05 DIAGNOSIS — E119 Type 2 diabetes mellitus without complications: Secondary | ICD-10-CM | POA: Diagnosis not present

## 2016-10-05 DIAGNOSIS — I509 Heart failure, unspecified: Secondary | ICD-10-CM | POA: Diagnosis not present

## 2016-11-26 DIAGNOSIS — E1122 Type 2 diabetes mellitus with diabetic chronic kidney disease: Secondary | ICD-10-CM | POA: Diagnosis not present

## 2016-11-26 DIAGNOSIS — N183 Chronic kidney disease, stage 3 (moderate): Secondary | ICD-10-CM | POA: Diagnosis not present

## 2016-11-26 DIAGNOSIS — I10 Essential (primary) hypertension: Secondary | ICD-10-CM | POA: Diagnosis not present

## 2016-11-26 DIAGNOSIS — I509 Heart failure, unspecified: Secondary | ICD-10-CM | POA: Diagnosis not present

## 2016-11-26 DIAGNOSIS — Z299 Encounter for prophylactic measures, unspecified: Secondary | ICD-10-CM | POA: Diagnosis not present

## 2016-11-26 DIAGNOSIS — Z6827 Body mass index (BMI) 27.0-27.9, adult: Secondary | ICD-10-CM | POA: Diagnosis not present

## 2016-11-26 DIAGNOSIS — I2510 Atherosclerotic heart disease of native coronary artery without angina pectoris: Secondary | ICD-10-CM | POA: Diagnosis not present

## 2016-11-26 DIAGNOSIS — N400 Enlarged prostate without lower urinary tract symptoms: Secondary | ICD-10-CM | POA: Diagnosis not present

## 2016-11-26 DIAGNOSIS — E7800 Pure hypercholesterolemia, unspecified: Secondary | ICD-10-CM | POA: Diagnosis not present

## 2016-12-30 DIAGNOSIS — E7800 Pure hypercholesterolemia, unspecified: Secondary | ICD-10-CM | POA: Diagnosis not present

## 2016-12-30 DIAGNOSIS — E119 Type 2 diabetes mellitus without complications: Secondary | ICD-10-CM | POA: Diagnosis not present

## 2016-12-30 DIAGNOSIS — I509 Heart failure, unspecified: Secondary | ICD-10-CM | POA: Diagnosis not present

## 2017-01-31 DIAGNOSIS — I509 Heart failure, unspecified: Secondary | ICD-10-CM | POA: Diagnosis not present

## 2017-01-31 DIAGNOSIS — E119 Type 2 diabetes mellitus without complications: Secondary | ICD-10-CM | POA: Diagnosis not present

## 2017-01-31 DIAGNOSIS — E7800 Pure hypercholesterolemia, unspecified: Secondary | ICD-10-CM | POA: Diagnosis not present

## 2017-02-09 DIAGNOSIS — E119 Type 2 diabetes mellitus without complications: Secondary | ICD-10-CM | POA: Diagnosis not present

## 2017-02-09 DIAGNOSIS — I509 Heart failure, unspecified: Secondary | ICD-10-CM | POA: Diagnosis not present

## 2017-02-09 DIAGNOSIS — E7800 Pure hypercholesterolemia, unspecified: Secondary | ICD-10-CM | POA: Diagnosis not present

## 2017-03-08 DIAGNOSIS — E7800 Pure hypercholesterolemia, unspecified: Secondary | ICD-10-CM | POA: Diagnosis not present

## 2017-03-08 DIAGNOSIS — I509 Heart failure, unspecified: Secondary | ICD-10-CM | POA: Diagnosis not present

## 2017-03-08 DIAGNOSIS — I2510 Atherosclerotic heart disease of native coronary artery without angina pectoris: Secondary | ICD-10-CM | POA: Diagnosis not present

## 2017-03-08 DIAGNOSIS — Z299 Encounter for prophylactic measures, unspecified: Secondary | ICD-10-CM | POA: Diagnosis not present

## 2017-03-08 DIAGNOSIS — E1122 Type 2 diabetes mellitus with diabetic chronic kidney disease: Secondary | ICD-10-CM | POA: Diagnosis not present

## 2017-03-08 DIAGNOSIS — I10 Essential (primary) hypertension: Secondary | ICD-10-CM | POA: Diagnosis not present

## 2017-03-08 DIAGNOSIS — E1165 Type 2 diabetes mellitus with hyperglycemia: Secondary | ICD-10-CM | POA: Diagnosis not present

## 2017-03-08 DIAGNOSIS — Z23 Encounter for immunization: Secondary | ICD-10-CM | POA: Diagnosis not present

## 2017-03-08 DIAGNOSIS — Z6826 Body mass index (BMI) 26.0-26.9, adult: Secondary | ICD-10-CM | POA: Diagnosis not present

## 2017-03-08 DIAGNOSIS — N183 Chronic kidney disease, stage 3 (moderate): Secondary | ICD-10-CM | POA: Diagnosis not present

## 2017-03-09 DIAGNOSIS — E119 Type 2 diabetes mellitus without complications: Secondary | ICD-10-CM | POA: Diagnosis not present

## 2017-03-09 DIAGNOSIS — I509 Heart failure, unspecified: Secondary | ICD-10-CM | POA: Diagnosis not present

## 2017-03-09 DIAGNOSIS — E7800 Pure hypercholesterolemia, unspecified: Secondary | ICD-10-CM | POA: Diagnosis not present

## 2017-03-15 ENCOUNTER — Encounter: Payer: Self-pay | Admitting: Cardiology

## 2017-03-15 ENCOUNTER — Ambulatory Visit (INDEPENDENT_AMBULATORY_CARE_PROVIDER_SITE_OTHER): Payer: Medicare Other | Admitting: Cardiology

## 2017-03-15 VITALS — BP 118/70 | HR 64 | Ht 68.5 in | Wt 170.6 lb

## 2017-03-15 DIAGNOSIS — E782 Mixed hyperlipidemia: Secondary | ICD-10-CM | POA: Diagnosis not present

## 2017-03-15 DIAGNOSIS — I255 Ischemic cardiomyopathy: Secondary | ICD-10-CM | POA: Diagnosis not present

## 2017-03-15 DIAGNOSIS — I2510 Atherosclerotic heart disease of native coronary artery without angina pectoris: Secondary | ICD-10-CM

## 2017-03-15 DIAGNOSIS — N183 Chronic kidney disease, stage 3 unspecified: Secondary | ICD-10-CM

## 2017-03-15 NOTE — Patient Instructions (Signed)

## 2017-03-15 NOTE — Progress Notes (Signed)
Cardiology Office Note  Date: 03/15/2017   ID: John Moore, DOB 09-Jun-1927, MRN [id number]  PCP: Glenda Chroman, MD  Primary Cardiologist: Rozann Lesches, MD   Chief Complaint  Patient presents with  . Cardiomyopathy    History of Present Illness: John Moore is an 83 y.o. male last seen in April. He presents today with his daughter for a follow-up visit. Continues to do remarkably well from a cardiac perspective. He does not report any angina symptoms or shortness of breath beyond NYHA class II. He is fairly sedentary. He does not report any orthopnea or PND. His weight has been stable.  He continues to follow with Dr. Woody Seller for primary care. We are requesting his most recent lab work.  I reviewed his medications. Current cardiac regimen includes aspirin, Norvasc, Coreg, Lasix, hydralazine, Benicar, potassium supplements, Zocor and as needed nitroglycerin.  Past Medical History:  Diagnosis Date  . ACE inhibitor intolerance    Tolerates ARB  . Allergic reaction to contrast dye   . CAD (coronary artery disease)    Multivessel status post CABG 2008  . Chronic systolic heart failure ([place])   . CKD (chronic kidney disease) stage 3, GFR 30-59 ml/min (HCC)   . Diverticulitis   . Dyslipidemia   . Dysphagia   . Essential hypertension   . GERD (gastroesophageal reflux disease)   . GI bleeding 2002  . Ischemic cardiomyopathy   . Leg cramps    Nocturnal  . Postoperative atrial fibrillation ([place])   . Prostate disorder   . Type 2 diabetes mellitus ([place])     Past Surgical History:  Procedure Laterality Date  . COLONOSCOPY  04/2001  . CORONARY ARTERY BYPASS GRAFT  05/2007   LIMA to LAD, SVG to diagonal, SVG to OM, SVG to RCA  . ESOPHAGOGASTRODUODENOSCOPY N/A 06/27/2014   Procedure: ESOPHAGOGASTRODUODENOSCOPY (EGD);  Surgeon: Rogene Houston, MD;  [facility];  Service: Endoscopy;  Laterality: N/A;  830  . MALONEY DILATION N/A 06/27/2014   Procedure: Venia Minks  DILATION;  Surgeon: Rogene Houston, MD;  [facility];  Service: Endoscopy;  Laterality: N/A;  . MYRINFOPLASTY    . ROTATOR CUFF REPAIR    . TONSILLECTOMY      Current Outpatient Prescriptions  Medication Sig Dispense Refill  . amLODipine (NORVASC) 10 MG tablet TAKE 1 TABLET BY MOUTH EVERY DAY 90 tablet 3  . aspirin EC 81 MG tablet Take 81 mg by mouth daily.    . carvedilol (COREG) 25 MG tablet TAKE 1/2 TABLET BY MOUTH TWICE DAILY 30 tablet 6  . Cholecalciferol (VITAMIN D3 PO) Take 1 tablet by mouth daily.    . furosemide (LASIX) 40 MG tablet Take 1 tablet by mouth 2 (two) times daily.    Marland Kitchen glipiZIDE (GLUCOTROL) 5 MG tablet Take by mouth 3 (three) times daily.    . hydrALAZINE (APRESOLINE) 25 MG tablet Take 1 tablet by mouth 2 (two) times daily.    Marland Kitchen linagliptin (TRADJENTA) 5 MG TABS tablet Take 5 mg by mouth daily.    . magnesium oxide (MAG-OX) 400 MG tablet Take 400 mg by mouth daily.    . Multiple Vitamin (MULTIVITAMIN) tablet Take 1 tablet by mouth daily.    . nitroGLYCERIN (NITROSTAT) 0.4 MG SL tablet Place 1 tablet (0.4 mg total) under the tongue every 5 (five) minutes as needed. For chest pains 25 tablet 3  . olmesartan (BENICAR) 40 MG tablet Take 1 tablet (40 mg total)  by mouth daily. 90 tablet 3  . pantoprazole (PROTONIX) 40 MG tablet TAKE 1 TABLET BY MOUTH DAILY. 90 tablet 4  . potassium chloride (MICRO-K) 10 MEQ CR capsule Take 10 mEq by mouth daily.     . simvastatin (ZOCOR) 20 MG tablet Take 1 tablet (20 mg total) by mouth at bedtime. 90 tablet 3   No current facility-administered medications for this visit.    Allergies:  Ace inhibitors; Nsaids; and Iodinated diagnostic agents   Social History: The patient  reports that he quit smoking about 38 years ago. His smoking use included Cigarettes. He started smoking about 68 years ago. He has a 9.00 pack-year smoking history. He has never used smokeless tobacco. He reports that he does not drink alcohol or use drugs.     ROS:  Please see the history of present illness. Otherwise, complete review of systems is positive for hearing loss.  All other systems are reviewed and negative.   Physical Exam: VS:  BP 118/70   Pulse 64   Ht 5' 8.5" (1.74 m)   Wt 170 lb 9.6 oz (77.4 kg)   SpO2 98%   BMI 25.56 kg/m , BMI Body mass index is 25.56 kg/m.  Wt Readings from Last 3 Encounters:  03/15/17 170 lb 9.6 oz (77.4 kg)  09/20/16 176 lb 3.2 oz (79.9 kg)  06/22/16 172 lb 6.4 oz (78.2 kg)    General: Elderly male, appears comfortable at rest. HEENT: Conjunctiva and lids normal, oropharynx clear. Neck: Supple, no elevated JVP or carotid bruits, no thyromegaly. Lungs: Clear to auscultation, nonlabored breathing at rest. Cardiac: Regular rate and rhythm, no S3, soft systolic murmur, no pericardial rub. Abdomen: Soft, nontender, bowel sounds present, no guarding or rebound. Extremities: No pitting edema, distal pulses 2+. Skin: Warm and dry. Musculoskeletal: Mild kyphosis. Neuropsychiatric: Alert and oriented x3, affect grossly appropriate.  ECG: I personally reviewed the tracing from 09/20/2016 which showed sinus rhythm with prolonged PR interval, left anterior fascicular block, frequent PVCs, and poor R-wave progression rule out old anterior infarct pattern.  Other Studies Reviewed Today:  Echocardiogram 01/08/2010 [facility]) reported mildly dilated LV with LVEF 20-25%, restrictive diastolic filling pattern with increased filling pressures, moderate left atrial enlargement, mildly reduced RV contraction, mild right atrial enlargement, sclerotic aortic valve, mild to moderate mitral regurgitation , mild tricuspid regurgitation, severe pulmonary hypertension with RVSP 63 mmHg.  Assessment and Plan:  1. Ischemic cardiomyopathy with previous assessment of LVEF in the 20-25% range. He has been remarkably stable on medical therapy over the last few years and we continue with conservative management without additional  ischemic testing. Fluid status looks well controlled at this time. We are requesting his most recent lab work from Dr. Woody Seller for review as well.  2. Multivessel CAD status post CABG in 2008. He does not report any angina symptoms on medical therapy. No nitroglycerin use.  3. CKD stage III, requesting lab work from Dr. Woody Seller. Last creatinine 1.6.  4. Hyperlipidemia, continues on statin therapy.  Current medicines were reviewed with the patient today.  Disposition: Follow-up in 6 months.  Signed, Satira Sark, MD, [facility] 03/15/2017 4:04 PM    [place] at [place], [place], [place] [address] Phone: [phone]; Fax: [phone]

## 2017-03-24 DIAGNOSIS — L570 Actinic keratosis: Secondary | ICD-10-CM | POA: Diagnosis not present

## 2017-04-05 ENCOUNTER — Other Ambulatory Visit: Payer: Self-pay | Admitting: Cardiology

## 2017-04-12 DIAGNOSIS — E7800 Pure hypercholesterolemia, unspecified: Secondary | ICD-10-CM | POA: Diagnosis not present

## 2017-04-12 DIAGNOSIS — E119 Type 2 diabetes mellitus without complications: Secondary | ICD-10-CM | POA: Diagnosis not present

## 2017-04-12 DIAGNOSIS — I509 Heart failure, unspecified: Secondary | ICD-10-CM | POA: Diagnosis not present

## 2017-04-15 DIAGNOSIS — Z7189 Other specified counseling: Secondary | ICD-10-CM | POA: Diagnosis not present

## 2017-04-15 DIAGNOSIS — Z0000 Encounter for general adult medical examination without abnormal findings: Secondary | ICD-10-CM | POA: Diagnosis not present

## 2017-04-15 DIAGNOSIS — N183 Chronic kidney disease, stage 3 (moderate): Secondary | ICD-10-CM | POA: Diagnosis not present

## 2017-04-15 DIAGNOSIS — I509 Heart failure, unspecified: Secondary | ICD-10-CM | POA: Diagnosis not present

## 2017-04-15 DIAGNOSIS — I10 Essential (primary) hypertension: Secondary | ICD-10-CM | POA: Diagnosis not present

## 2017-04-15 DIAGNOSIS — E1122 Type 2 diabetes mellitus with diabetic chronic kidney disease: Secondary | ICD-10-CM | POA: Diagnosis not present

## 2017-04-15 DIAGNOSIS — E7800 Pure hypercholesterolemia, unspecified: Secondary | ICD-10-CM | POA: Diagnosis not present

## 2017-04-15 DIAGNOSIS — Z299 Encounter for prophylactic measures, unspecified: Secondary | ICD-10-CM | POA: Diagnosis not present

## 2017-04-15 DIAGNOSIS — Z1331 Encounter for screening for depression: Secondary | ICD-10-CM | POA: Diagnosis not present

## 2017-04-15 DIAGNOSIS — Z1339 Encounter for screening examination for other mental health and behavioral disorders: Secondary | ICD-10-CM | POA: Diagnosis not present

## 2017-04-15 DIAGNOSIS — E1165 Type 2 diabetes mellitus with hyperglycemia: Secondary | ICD-10-CM | POA: Diagnosis not present

## 2017-04-15 DIAGNOSIS — Z6826 Body mass index (BMI) 26.0-26.9, adult: Secondary | ICD-10-CM | POA: Diagnosis not present

## 2017-04-18 DIAGNOSIS — R5383 Other fatigue: Secondary | ICD-10-CM | POA: Diagnosis not present

## 2017-04-18 DIAGNOSIS — E7800 Pure hypercholesterolemia, unspecified: Secondary | ICD-10-CM | POA: Diagnosis not present

## 2017-04-18 DIAGNOSIS — Z79899 Other long term (current) drug therapy: Secondary | ICD-10-CM | POA: Diagnosis not present

## 2017-04-18 DIAGNOSIS — Z125 Encounter for screening for malignant neoplasm of prostate: Secondary | ICD-10-CM | POA: Diagnosis not present

## 2017-05-10 DIAGNOSIS — E7800 Pure hypercholesterolemia, unspecified: Secondary | ICD-10-CM | POA: Diagnosis not present

## 2017-05-10 DIAGNOSIS — R972 Elevated prostate specific antigen [PSA]: Secondary | ICD-10-CM | POA: Diagnosis not present

## 2017-05-10 DIAGNOSIS — I509 Heart failure, unspecified: Secondary | ICD-10-CM | POA: Diagnosis not present

## 2017-05-10 DIAGNOSIS — E119 Type 2 diabetes mellitus without complications: Secondary | ICD-10-CM | POA: Diagnosis not present

## 2017-05-24 ENCOUNTER — Other Ambulatory Visit: Payer: Self-pay | Admitting: Cardiology

## 2017-06-06 ENCOUNTER — Encounter (INDEPENDENT_AMBULATORY_CARE_PROVIDER_SITE_OTHER): Payer: Self-pay | Admitting: Internal Medicine

## 2017-06-22 ENCOUNTER — Ambulatory Visit (INDEPENDENT_AMBULATORY_CARE_PROVIDER_SITE_OTHER): Payer: Medicare Other | Admitting: Internal Medicine

## 2017-06-28 DIAGNOSIS — I509 Heart failure, unspecified: Secondary | ICD-10-CM | POA: Diagnosis not present

## 2017-06-28 DIAGNOSIS — E1165 Type 2 diabetes mellitus with hyperglycemia: Secondary | ICD-10-CM | POA: Diagnosis not present

## 2017-06-28 DIAGNOSIS — Z6827 Body mass index (BMI) 27.0-27.9, adult: Secondary | ICD-10-CM | POA: Diagnosis not present

## 2017-06-28 DIAGNOSIS — Z299 Encounter for prophylactic measures, unspecified: Secondary | ICD-10-CM | POA: Diagnosis not present

## 2017-06-28 DIAGNOSIS — E1122 Type 2 diabetes mellitus with diabetic chronic kidney disease: Secondary | ICD-10-CM | POA: Diagnosis not present

## 2017-06-28 DIAGNOSIS — N183 Chronic kidney disease, stage 3 (moderate): Secondary | ICD-10-CM | POA: Diagnosis not present

## 2017-06-29 ENCOUNTER — Ambulatory Visit (INDEPENDENT_AMBULATORY_CARE_PROVIDER_SITE_OTHER): Payer: Medicare Other | Admitting: Internal Medicine

## 2017-06-29 ENCOUNTER — Encounter (INDEPENDENT_AMBULATORY_CARE_PROVIDER_SITE_OTHER): Payer: Self-pay | Admitting: Internal Medicine

## 2017-06-29 VITALS — BP 100/68 | HR 64 | Temp 97.0°F | Ht 68.0 in | Wt 168.4 lb

## 2017-06-29 DIAGNOSIS — R1310 Dysphagia, unspecified: Secondary | ICD-10-CM | POA: Diagnosis not present

## 2017-06-29 DIAGNOSIS — R1319 Other dysphagia: Secondary | ICD-10-CM

## 2017-06-29 NOTE — Progress Notes (Signed)
Subjective:    Patient ID: John Moore, male    DOB: 22-Jan-1928, 84 y.o.   MRN: [id number] 06/2016 172 HPI Here today for f/u. Last seen in January of 2018. Hx of esophageal dysphagia.  He tells me he is doing good. He denies any dysphagia. He can about anything he wants. He has no abdominal pain. He has a BM daily. He remains alert and oriented. He continues to drive during the day.  He is widowed.  06/27/2014: EGD with ED  Indications: Patient is 84 year old Caucasian male was chronic GERD complicated by distal esophageal stricture was dilated in March 2013 and he now presents with intermittent solid food dysphagia. Heartburn is well controlled with therapy.  Impression: Ringlike stricture at GE junction dilated by passing 47 [place] Maloney dilator. Small sliding hiatal hernia.   Procedure Date: 08/26/2011 Procedure: EGD with ED. Indications: Patient is an 84 year old Caucasian male who underwent EGD back in October 2012 have ulcerative esophagitis with a high-grade stricture which was dilated to 16.5 mm with a balloon. Patient was advised to return for office visit but he did not. He felt so much better he decided to cut his medication. However now he has developed intermittent solid food dysphagia. He is therefore undergoing repeat dilation. Impression: Healed esophagitis. Stricture at GE junction and dilated with a balloon to 18 mm. Small sliding hiatal hernia.  Review of Systems Past Medical History:  Diagnosis Date  . ACE inhibitor intolerance    Tolerates ARB  . Allergic reaction to contrast dye   . CAD (coronary artery disease)    Multivessel status post CABG 2008  . Chronic systolic heart failure ([place])   . CKD (chronic kidney disease)  stage 3, GFR 30-59 ml/min (HCC)   . Diverticulitis   . Dyslipidemia   . Dysphagia   . Essential hypertension   . GERD (gastroesophageal reflux disease)   . GI bleeding 2002  . Ischemic cardiomyopathy   . Leg cramps    Nocturnal  . Postoperative atrial fibrillation ([place])   . Prostate disorder   . Type 2 diabetes mellitus ([place])     Past Surgical History:  Procedure Laterality Date  . COLONOSCOPY  04/2001  . CORONARY ARTERY BYPASS GRAFT  05/2007   LIMA to LAD, SVG to diagonal, SVG to OM, SVG to RCA  . ESOPHAGOGASTRODUODENOSCOPY N/A 06/27/2014   Procedure: ESOPHAGOGASTRODUODENOSCOPY (EGD);  Surgeon: Rogene Houston, MD;  [facility];  Service: Endoscopy;  Laterality: N/A;  830  . MALONEY DILATION N/A 06/27/2014   Procedure: Venia Minks DILATION;  Surgeon: Rogene Houston, MD;  [facility];  Service: Endoscopy;  Laterality: N/A;  . MYRINFOPLASTY    . ROTATOR CUFF REPAIR    . TONSILLECTOMY      Allergies  Allergen Reactions  . Ace Inhibitors Other (See Comments)    unknown  . Nsaids Other (See Comments)    unknown  . Iodinated Diagnostic Agents Rash    Current Outpatient Medications on File Prior to Visit  Medication Sig Dispense Refill  . amLODipine (NORVASC) 10 MG tablet TAKE 1 TABLET BY MOUTH EVERY DAY 90 tablet 3  . aspirin EC 81 MG tablet Take 81 mg by mouth daily.    . carvedilol (COREG) 25 MG tablet TAKE 1/2 TABLET BY MOUTH TWICE DAILY please cut in half FOR patient 30 tablet 3  . Cholecalciferol (VITAMIN D3 PO) Take 1 tablet by mouth daily.    . furosemide (LASIX) 40 MG tablet  Take 1 tablet by mouth 2 (two) times daily.    Marland Kitchen glipiZIDE (GLUCOTROL) 5 MG tablet Take by mouth 3 (three) times daily.    . hydrALAZINE (APRESOLINE) 25 MG tablet Take 1 tablet by mouth 2 (two) times daily.    Marland Kitchen linagliptin (TRADJENTA) 5 MG TABS tablet Take 5 mg by mouth daily.    . magnesium oxide (MAG-OX) 400 MG tablet Take 400 mg by mouth daily.    . Multiple Vitamin  (MULTIVITAMIN) tablet Take 1 tablet by mouth daily.    . nitroGLYCERIN (NITROSTAT) 0.4 MG SL tablet Place 1 tablet (0.4 mg total) under the tongue every 5 (five) minutes as needed. For chest pains 25 tablet 3  . olmesartan (BENICAR) 40 MG tablet Take 1 tablet (40 mg total) by mouth daily. 90 tablet 3  . pantoprazole (PROTONIX) 40 MG tablet TAKE 1 TABLET BY MOUTH DAILY. 90 tablet 4  . potassium chloride (MICRO-K) 10 MEQ CR capsule Take 10 mEq by mouth daily.     . simvastatin (ZOCOR) 20 MG tablet Take 1 tablet (20 mg total) by mouth at bedtime. 90 tablet 3   No current facility-administered medications on file prior to visit.         Objective:   Physical Exam Blood pressure 100/68, pulse 64, temperature (!) 97 F (36.1 C), height 5\' 8"  (1.727 m), weight 168 lb 6.4 oz (76.4 kg). Alert and oriented. Skin warm and dry. Oral mucosa is moist.   . Sclera anicteric, conjunctivae is pink. Thyroid not enlarged. No cervical lymphadenopathy. Lungs clear. Heart regular rate and rhythm.  Abdomen is soft. Bowel sounds are positive. No hepatomegaly. No abdominal masses felt. No tenderness.  No edema to lower extremities.           Assessment & Plan:  Dysphagia. He is doing well. He is not having any problems. OV in 1year.

## 2017-06-29 NOTE — Patient Instructions (Signed)
OV in 1 year.  

## 2017-07-19 DIAGNOSIS — E7800 Pure hypercholesterolemia, unspecified: Secondary | ICD-10-CM | POA: Diagnosis not present

## 2017-07-19 DIAGNOSIS — I509 Heart failure, unspecified: Secondary | ICD-10-CM | POA: Diagnosis not present

## 2017-07-19 DIAGNOSIS — E119 Type 2 diabetes mellitus without complications: Secondary | ICD-10-CM | POA: Diagnosis not present

## 2017-08-08 DIAGNOSIS — C4442 Squamous cell carcinoma of skin of scalp and neck: Secondary | ICD-10-CM | POA: Diagnosis not present

## 2017-08-08 DIAGNOSIS — L570 Actinic keratosis: Secondary | ICD-10-CM | POA: Diagnosis not present

## 2017-08-10 DIAGNOSIS — E7800 Pure hypercholesterolemia, unspecified: Secondary | ICD-10-CM | POA: Diagnosis not present

## 2017-08-10 DIAGNOSIS — I509 Heart failure, unspecified: Secondary | ICD-10-CM | POA: Diagnosis not present

## 2017-08-10 DIAGNOSIS — E119 Type 2 diabetes mellitus without complications: Secondary | ICD-10-CM | POA: Diagnosis not present

## 2017-08-12 ENCOUNTER — Other Ambulatory Visit: Payer: Self-pay | Admitting: Cardiology

## 2017-09-07 DIAGNOSIS — C44629 Squamous cell carcinoma of skin of left upper limb, including shoulder: Secondary | ICD-10-CM | POA: Diagnosis not present

## 2017-09-07 DIAGNOSIS — C4442 Squamous cell carcinoma of skin of scalp and neck: Secondary | ICD-10-CM | POA: Diagnosis not present

## 2017-09-07 DIAGNOSIS — D485 Neoplasm of uncertain behavior of skin: Secondary | ICD-10-CM | POA: Diagnosis not present

## 2017-09-20 NOTE — Progress Notes (Signed)
Cardiology Office Note  Date: 09/21/2017   ID: John Moore, DOB 1927/08/17, MRN [id number]  PCP: Glenda Chroman, MD  Primary Cardiologist: Rozann Lesches, MD   Chief Complaint  Patient presents with  . Coronary Artery Disease    History of Present Illness: John Moore is an 84 y.o. male last seen in October 2018.  He is here today with his daughter for a follow-up visit.  Reports no angina symptoms and stable NYHA class II dyspnea with basic ADLs.  He states that he takes his medications regularly, has had stable weights and no significant leg swelling on current dose of Lasix.  Current cardiac regimen includes aspirin, Norvasc, Coreg, Lasix, hydralazine, Benicar, Zocor, and as needed nitroglycerin.  Continue to follow with Dr. Woody Seller.  No other major health changes, has had some recent skin lesions removed.  Past Medical History:  Diagnosis Date  . ACE inhibitor intolerance    Tolerates ARB  . Allergic reaction to contrast dye   . CAD (coronary artery disease)    Multivessel status post CABG 2008  . Chronic systolic heart failure ([place])   . CKD (chronic kidney disease) stage 3, GFR 30-59 ml/min (HCC)   . Diverticulitis   . Dyslipidemia   . Dysphagia   . Essential hypertension   . GERD (gastroesophageal reflux disease)   . GI bleeding 2002  . Ischemic cardiomyopathy   . Leg cramps    Nocturnal  . Postoperative atrial fibrillation ([place])   . Prostate disorder   . Type 2 diabetes mellitus ([place])     Past Surgical History:  Procedure Laterality Date  . COLONOSCOPY  04/2001  . CORONARY ARTERY BYPASS GRAFT  05/2007   LIMA to LAD, SVG to diagonal, SVG to OM, SVG to RCA  . ESOPHAGOGASTRODUODENOSCOPY N/A 06/27/2014   Procedure: ESOPHAGOGASTRODUODENOSCOPY (EGD);  Surgeon: Rogene Houston, MD;  [facility];  Service: Endoscopy;  Laterality: N/A;  830  . MALONEY DILATION N/A 06/27/2014   Procedure: Venia Minks DILATION;  Surgeon: Rogene Houston, MD;  [facility];  Service: Endoscopy;  Laterality: N/A;  . MYRINFOPLASTY    . ROTATOR CUFF REPAIR    . TONSILLECTOMY      Current Outpatient Medications  Medication Sig Dispense Refill  . amLODipine (NORVASC) 10 MG tablet TAKE 1 TABLET BY MOUTH EVERY DAY 90 tablet 3  . aspirin EC 81 MG tablet Take 81 mg by mouth daily.    . carvedilol (COREG) 25 MG tablet TAKE 1/2 TABLET BY MOUTH TWICE DAILY please cut in half FOR patient 30 tablet 3  . Cholecalciferol (VITAMIN D3 PO) Take 1 tablet by mouth daily.    . furosemide (LASIX) 40 MG tablet Take 1 tablet by mouth 2 (two) times daily.    Marland Kitchen glipiZIDE (GLUCOTROL) 5 MG tablet Take by mouth 3 (three) times daily.    . hydrALAZINE (APRESOLINE) 25 MG tablet Take 1 tablet by mouth 2 (two) times daily.    Marland Kitchen linagliptin (TRADJENTA) 5 MG TABS tablet Take 5 mg by mouth daily.    . magnesium oxide (MAG-OX) 400 MG tablet Take 400 mg by mouth daily.    . Multiple Vitamin (MULTIVITAMIN) tablet Take 1 tablet by mouth daily.    . nitroGLYCERIN (NITROSTAT) 0.4 MG SL tablet Place 1 tablet (0.4 mg total) under the tongue every 5 (five) minutes as needed. For chest pains 25 tablet 3  . olmesartan (BENICAR) 40 MG tablet Take 1 tablet (40  mg total) by mouth daily. 90 tablet 3  . pantoprazole (PROTONIX) 40 MG tablet TAKE 1 TABLET BY MOUTH DAILY. 90 tablet 4  . potassium chloride (MICRO-K) 10 MEQ CR capsule Take 10 mEq by mouth daily.     . simvastatin (ZOCOR) 20 MG tablet Take 1 tablet (20 mg total) by mouth at bedtime. 90 tablet 3   No current facility-administered medications for this visit.    Allergies:  Ace inhibitors; Nsaids; and Iodinated diagnostic agents   Social History: The patient  reports that he quit smoking about 39 years ago. His smoking use included cigarettes. He started smoking about 69 years ago. He has a 9.00 pack-year smoking history. He has never used smokeless tobacco. He reports that he does not drink alcohol or use drugs.   ROS:  Please see the  history of present illness. Otherwise, complete review of systems is positive for hearing loss.  All other systems are reviewed and negative.   Physical Exam: VS:  BP 122/62   Pulse (!) 51   Ht 5' 8.5" (1.74 m)   Wt 168 lb (76.2 kg)   SpO2 96%   BMI 25.17 kg/m , BMI Body mass index is 25.17 kg/m.  Wt Readings from Last 3 Encounters:  09/21/17 168 lb (76.2 kg)  06/29/17 168 lb 6.4 oz (76.4 kg)  03/15/17 170 lb 9.6 oz (77.4 kg)    General: Elderly male, appears comfortable at rest. HEENT: Conjunctiva and lids normal, oropharynx clear. Neck: Supple, no elevated JVP or carotid bruits, no thyromegaly. Lungs: Clear to auscultation, nonlabored breathing at rest. Cardiac: Regular rate and rhythm, no S3, soft systolic murmur. Abdomen: Soft, nontender, bowel sounds present. Extremities: No pitting edema, distal pulses 2+. Skin: Warm and dry. Musculoskeletal: Mild kyphosis. Neuropsychiatric: Alert and oriented x3, affect grossly appropriate.  ECG: I personally reviewed the tracing from 09/20/2016 which showed sinus rhythm with prolonged PR interval, left anterior fascicular block, frequent PVCs, and poor R-wave progression rule out old anterior infarct pattern.  Other Studies Reviewed Today:  Echocardiogram 01/08/2010 [facility]) reported mildly dilated LV with LVEF 20-25%, restrictive diastolic filling pattern with increased filling pressures, moderate left atrial enlargement, mildly reduced RV contraction, mild right atrial enlargement, sclerotic aortic valve, mild to moderate mitral regurgitation, mild tricuspid regurgitation, severe pulmonary hypertension with RVSP 63 mmHg.   Assessment and Plan:  1.  Multivessel CAD status post CABG in 2008.  We will continue with conservative medical management in the absence of accelerating angina.  He remains on aspirin and statin.  2.  Ischemic cardiomyopathy with LVEF in the 20 to 25% range.  He is not a candidate for ICD and we continue with medical  therapy.  Weight is stable and he has no evidence of fluid overload at this time.  Current medicines were reviewed with the patient today.   Orders Placed This Encounter  Procedures  . EKG 12-Lead    Disposition: Follow-up in 6 months.  Signed, Satira Sark, MD, [facility] 09/21/2017 3:47 PM    [place] at [place], [place],  [address] Phone: [phone]; Fax: [phone]

## 2017-09-21 ENCOUNTER — Encounter: Payer: Self-pay | Admitting: Cardiology

## 2017-09-21 ENCOUNTER — Ambulatory Visit (INDEPENDENT_AMBULATORY_CARE_PROVIDER_SITE_OTHER): Payer: Medicare Other | Admitting: Cardiology

## 2017-09-21 VITALS — BP 122/62 | HR 51 | Ht 68.5 in | Wt 168.0 lb

## 2017-09-21 DIAGNOSIS — I255 Ischemic cardiomyopathy: Secondary | ICD-10-CM

## 2017-09-21 DIAGNOSIS — I2510 Atherosclerotic heart disease of native coronary artery without angina pectoris: Secondary | ICD-10-CM

## 2017-09-21 NOTE — Patient Instructions (Signed)

## 2017-09-27 DIAGNOSIS — E119 Type 2 diabetes mellitus without complications: Secondary | ICD-10-CM | POA: Diagnosis not present

## 2017-09-27 DIAGNOSIS — E7800 Pure hypercholesterolemia, unspecified: Secondary | ICD-10-CM | POA: Diagnosis not present

## 2017-09-27 DIAGNOSIS — I509 Heart failure, unspecified: Secondary | ICD-10-CM | POA: Diagnosis not present

## 2017-10-04 DIAGNOSIS — Z6826 Body mass index (BMI) 26.0-26.9, adult: Secondary | ICD-10-CM | POA: Diagnosis not present

## 2017-10-04 DIAGNOSIS — E1122 Type 2 diabetes mellitus with diabetic chronic kidney disease: Secondary | ICD-10-CM | POA: Diagnosis not present

## 2017-10-04 DIAGNOSIS — E1165 Type 2 diabetes mellitus with hyperglycemia: Secondary | ICD-10-CM | POA: Diagnosis not present

## 2017-10-04 DIAGNOSIS — I509 Heart failure, unspecified: Secondary | ICD-10-CM | POA: Diagnosis not present

## 2017-10-04 DIAGNOSIS — N183 Chronic kidney disease, stage 3 (moderate): Secondary | ICD-10-CM | POA: Diagnosis not present

## 2017-10-04 DIAGNOSIS — I10 Essential (primary) hypertension: Secondary | ICD-10-CM | POA: Diagnosis not present

## 2017-10-04 DIAGNOSIS — Z299 Encounter for prophylactic measures, unspecified: Secondary | ICD-10-CM | POA: Diagnosis not present

## 2017-10-10 ENCOUNTER — Other Ambulatory Visit: Payer: Self-pay | Admitting: *Deleted

## 2017-10-10 MED ORDER — CARVEDILOL 25 MG PO TABS: ORAL_TABLET | ORAL | 1 refills | Status: DC

## 2017-10-12 DIAGNOSIS — S0100XD Unspecified open wound of scalp, subsequent encounter: Secondary | ICD-10-CM | POA: Diagnosis not present

## 2017-10-18 DIAGNOSIS — I509 Heart failure, unspecified: Secondary | ICD-10-CM | POA: Diagnosis not present

## 2017-10-18 DIAGNOSIS — E119 Type 2 diabetes mellitus without complications: Secondary | ICD-10-CM | POA: Diagnosis not present

## 2017-10-18 DIAGNOSIS — E7800 Pure hypercholesterolemia, unspecified: Secondary | ICD-10-CM | POA: Diagnosis not present

## 2017-10-26 DIAGNOSIS — S0100XD Unspecified open wound of scalp, subsequent encounter: Secondary | ICD-10-CM | POA: Diagnosis not present

## 2017-11-09 DIAGNOSIS — S0100XD Unspecified open wound of scalp, subsequent encounter: Secondary | ICD-10-CM | POA: Diagnosis not present

## 2017-11-11 ENCOUNTER — Other Ambulatory Visit (INDEPENDENT_AMBULATORY_CARE_PROVIDER_SITE_OTHER): Payer: Self-pay | Admitting: Internal Medicine

## 2017-11-22 DIAGNOSIS — E119 Type 2 diabetes mellitus without complications: Secondary | ICD-10-CM | POA: Diagnosis not present

## 2017-11-22 DIAGNOSIS — I509 Heart failure, unspecified: Secondary | ICD-10-CM | POA: Diagnosis not present

## 2017-11-22 DIAGNOSIS — E7800 Pure hypercholesterolemia, unspecified: Secondary | ICD-10-CM | POA: Diagnosis not present

## 2017-12-27 DIAGNOSIS — E119 Type 2 diabetes mellitus without complications: Secondary | ICD-10-CM | POA: Diagnosis not present

## 2017-12-27 DIAGNOSIS — I509 Heart failure, unspecified: Secondary | ICD-10-CM | POA: Diagnosis not present

## 2017-12-27 DIAGNOSIS — E7800 Pure hypercholesterolemia, unspecified: Secondary | ICD-10-CM | POA: Diagnosis not present

## 2018-01-09 DIAGNOSIS — E11319 Type 2 diabetes mellitus with unspecified diabetic retinopathy without macular edema: Secondary | ICD-10-CM | POA: Diagnosis not present

## 2018-01-10 DIAGNOSIS — Z299 Encounter for prophylactic measures, unspecified: Secondary | ICD-10-CM | POA: Diagnosis not present

## 2018-01-10 DIAGNOSIS — N183 Chronic kidney disease, stage 3 (moderate): Secondary | ICD-10-CM | POA: Diagnosis not present

## 2018-01-10 DIAGNOSIS — Z6826 Body mass index (BMI) 26.0-26.9, adult: Secondary | ICD-10-CM | POA: Diagnosis not present

## 2018-01-10 DIAGNOSIS — E1165 Type 2 diabetes mellitus with hyperglycemia: Secondary | ICD-10-CM | POA: Diagnosis not present

## 2018-01-10 DIAGNOSIS — E1122 Type 2 diabetes mellitus with diabetic chronic kidney disease: Secondary | ICD-10-CM | POA: Diagnosis not present

## 2018-01-10 DIAGNOSIS — I10 Essential (primary) hypertension: Secondary | ICD-10-CM | POA: Diagnosis not present

## 2018-01-24 DIAGNOSIS — I509 Heart failure, unspecified: Secondary | ICD-10-CM | POA: Diagnosis not present

## 2018-01-24 DIAGNOSIS — E7800 Pure hypercholesterolemia, unspecified: Secondary | ICD-10-CM | POA: Diagnosis not present

## 2018-01-24 DIAGNOSIS — E119 Type 2 diabetes mellitus without complications: Secondary | ICD-10-CM | POA: Diagnosis not present

## 2018-02-21 DIAGNOSIS — I509 Heart failure, unspecified: Secondary | ICD-10-CM | POA: Diagnosis not present

## 2018-02-21 DIAGNOSIS — E7800 Pure hypercholesterolemia, unspecified: Secondary | ICD-10-CM | POA: Diagnosis not present

## 2018-02-21 DIAGNOSIS — E119 Type 2 diabetes mellitus without complications: Secondary | ICD-10-CM | POA: Diagnosis not present

## 2018-03-03 DIAGNOSIS — H25012 Cortical age-related cataract, left eye: Secondary | ICD-10-CM | POA: Diagnosis not present

## 2018-03-03 DIAGNOSIS — H25013 Cortical age-related cataract, bilateral: Secondary | ICD-10-CM | POA: Diagnosis not present

## 2018-03-03 DIAGNOSIS — H35033 Hypertensive retinopathy, bilateral: Secondary | ICD-10-CM | POA: Diagnosis not present

## 2018-03-03 DIAGNOSIS — H35363 Drusen (degenerative) of macula, bilateral: Secondary | ICD-10-CM | POA: Diagnosis not present

## 2018-03-03 DIAGNOSIS — E113393 Type 2 diabetes mellitus with moderate nonproliferative diabetic retinopathy without macular edema, bilateral: Secondary | ICD-10-CM | POA: Diagnosis not present

## 2018-03-03 DIAGNOSIS — H2513 Age-related nuclear cataract, bilateral: Secondary | ICD-10-CM | POA: Diagnosis not present

## 2018-03-03 DIAGNOSIS — H2512 Age-related nuclear cataract, left eye: Secondary | ICD-10-CM | POA: Diagnosis not present

## 2018-03-17 NOTE — Progress Notes (Signed)
Cardiology Office Note  Date: 03/20/2018   ID: John Moore, DOB 08-28-1927, MRN [id number]  PCP: Glenda Chroman, MD  Primary Cardiologist: Rozann Lesches, MD   Chief Complaint  Patient presents with  . Coronary Artery Disease    History of Present Illness: John Moore is an 84 y.o. male last seen in April.  He is here today with family member for a routine visit.  From a cardiac perspective he does not report any angina symptoms or worsening shortness of breath, no palpitations or syncope.  He is fairly sedentary, watches a lot of television.  He tells me that he is having cataract surgery tomorrow, first on the left side and then a follow-up procedure on the right side in November.  Reviewed his medications.  Cardiac regimen includes aspirin, Coreg, Norvasc, Lasix, hydralazine, Benicar, Zocor, and as needed nitroglycerin which he has not required recently.  Past Medical History:  Diagnosis Date  . ACE inhibitor intolerance    Tolerates ARB  . Allergic reaction to contrast dye   . CAD (coronary artery disease)    Multivessel status post CABG 2008  . Chronic systolic heart failure ([place])   . CKD (chronic kidney disease) stage 3, GFR 30-59 ml/min (HCC)   . Diverticulitis   . Dyslipidemia   . Dysphagia   . Essential hypertension   . GERD (gastroesophageal reflux disease)   . GI bleeding 2002  . Ischemic cardiomyopathy   . Leg cramps    Nocturnal  . Postoperative atrial fibrillation ([place])   . Prostate disorder   . Type 2 diabetes mellitus ([place])     Past Surgical History:  Procedure Laterality Date  . COLONOSCOPY  04/2001  . CORONARY ARTERY BYPASS GRAFT  05/2007   LIMA to LAD, SVG to diagonal, SVG to OM, SVG to RCA  . ESOPHAGOGASTRODUODENOSCOPY N/A 06/27/2014   Procedure: ESOPHAGOGASTRODUODENOSCOPY (EGD);  Surgeon: Rogene Houston, MD;  [facility];  Service: Endoscopy;  Laterality: N/A;  830  . MALONEY DILATION N/A 06/27/2014   Procedure: Venia Minks  DILATION;  Surgeon: Rogene Houston, MD;  [facility];  Service: Endoscopy;  Laterality: N/A;  . MYRINFOPLASTY    . ROTATOR CUFF REPAIR    . TONSILLECTOMY      Current Outpatient Medications  Medication Sig Dispense Refill  . amLODipine (NORVASC) 10 MG tablet TAKE 1 TABLET BY MOUTH EVERY DAY 90 tablet 3  . aspirin EC 81 MG tablet Take 81 mg by mouth daily.    . carvedilol (COREG) 25 MG tablet TAKE 1/2 TABLET BY MOUTH TWICE DAILY please cut in half FOR patient 90 tablet 1  . Cholecalciferol (VITAMIN D3 PO) Take 1 tablet by mouth daily.    . furosemide (LASIX) 40 MG tablet Take 1 tablet by mouth 2 (two) times daily.    Marland Kitchen glipiZIDE (GLUCOTROL) 5 MG tablet Take by mouth 3 (three) times daily.    . hydrALAZINE (APRESOLINE) 25 MG tablet Take 1 tablet by mouth 2 (two) times daily.    Marland Kitchen linagliptin (TRADJENTA) 5 MG TABS tablet Take 5 mg by mouth daily.    . magnesium oxide (MAG-OX) 400 MG tablet Take 400 mg by mouth daily.    . Multiple Vitamin (MULTIVITAMIN) tablet Take 1 tablet by mouth daily.    . nitroGLYCERIN (NITROSTAT) 0.4 MG SL tablet Place 1 tablet (0.4 mg total) under the tongue every 5 (five) minutes as needed. For chest pains 25 tablet 3  .  olmesartan (BENICAR) 40 MG tablet Take 1 tablet (40 mg total) by mouth daily. 90 tablet 3  . pantoprazole (PROTONIX) 40 MG tablet TAKE 1 TABLET BY MOUTH DAILY. 90 tablet 3  . potassium chloride (MICRO-K) 10 MEQ CR capsule Take 10 mEq by mouth daily.     . simvastatin (ZOCOR) 20 MG tablet Take 1 tablet (20 mg total) by mouth at bedtime. 90 tablet 3   No current facility-administered medications for this visit.    Allergies:  Ace inhibitors; Nsaids; and Iodinated diagnostic agents   Social History: The patient  reports that he quit smoking about 39 years ago. His smoking use included cigarettes. He started smoking about 69 years ago. He has a 9.00 pack-year smoking history. He has never used smokeless tobacco. He reports that he does not  drink alcohol or use drugs.   ROS:  Please see the history of present illness. Otherwise, complete review of systems is positive for hearing loss.  All other systems are reviewed and negative.   Physical Exam: VS:  BP 114/64   Pulse (!) 55   Ht 5' 8.5" (1.74 m)   Wt 166 lb 9.6 oz (75.6 kg)   SpO2 98%   BMI 24.96 kg/m , BMI Body mass index is 24.96 kg/m.  Wt Readings from Last 3 Encounters:  03/20/18 166 lb 9.6 oz (75.6 kg)  09/21/17 168 lb (76.2 kg)  06/29/17 168 lb 6.4 oz (76.4 kg)    General: Elderly male, appears comfortable at rest. HEENT: Conjunctiva and lids normal, oropharynx clear. Neck: Supple, no elevated JVP or carotid bruits, no thyromegaly. Lungs: Clear to auscultation, nonlabored breathing at rest. Cardiac: Regular rate and rhythm, no S3, soft systolic murmur. Abdomen: Soft, nontender, bowel sounds present. Extremities: No pitting edema, distal pulses 2+. Skin: Warm and dry. Musculoskeletal: No kyphosis. Neuropsychiatric: Alert and oriented x3, affect grossly appropriate.  ECG: I personally reviewed the tracing from 09/21/2017 which showed atrial flutter with variable conduction, IVCD with poor R wave progression.  Other Studies Reviewed Today:  Echocardiogram 01/08/2010 [facility]) reported mildly dilated LV with LVEF 20-25%, restrictive diastolic filling pattern with increased filling pressures, moderate left atrial enlargement, mildly reduced RV contraction, mild right atrial enlargement, sclerotic aortic valve, mild to moderate mitral regurgitation, mild tricuspid regurgitation, severe pulmonary hypertension with RVSP 63 mmHg.   Assessment and Plan:  1.  Multivessel CAD status post CABG in 2008.  He is stable without active angina symptoms on medical therapy and we plan to continue conservative follow-up.  No changes were made today.  2.  Patient planned for staged, bilateral cataract surgery.  This is low risk from a cardiac perspective and he does not require  further cardiac testing at this time.  3.  Ischemic cardiomyopathy with LVEF 20 to 25%, also being managed conservatively.  He reports no palpitations or syncope.  He is not a candidate for ICD.  Current medicines were reviewed with the patient today.  Disposition: Follow-up in 6 months.  Signed, Satira Sark, MD, [facility] 03/20/2018 3:39 PM    [place] at [place], [place], [place] [address] Phone: [phone]; Fax: [phone]

## 2018-03-20 ENCOUNTER — Encounter: Payer: Self-pay | Admitting: Cardiology

## 2018-03-20 ENCOUNTER — Ambulatory Visit (INDEPENDENT_AMBULATORY_CARE_PROVIDER_SITE_OTHER): Payer: Medicare Other | Admitting: Cardiology

## 2018-03-20 VITALS — BP 114/64 | HR 55 | Ht 68.5 in | Wt 166.6 lb

## 2018-03-20 DIAGNOSIS — I255 Ischemic cardiomyopathy: Secondary | ICD-10-CM | POA: Diagnosis not present

## 2018-03-20 DIAGNOSIS — I2510 Atherosclerotic heart disease of native coronary artery without angina pectoris: Secondary | ICD-10-CM | POA: Diagnosis not present

## 2018-03-20 NOTE — Patient Instructions (Signed)

## 2018-03-21 DIAGNOSIS — H25812 Combined forms of age-related cataract, left eye: Secondary | ICD-10-CM | POA: Diagnosis not present

## 2018-03-21 DIAGNOSIS — E119 Type 2 diabetes mellitus without complications: Secondary | ICD-10-CM | POA: Diagnosis not present

## 2018-03-21 DIAGNOSIS — I509 Heart failure, unspecified: Secondary | ICD-10-CM | POA: Diagnosis not present

## 2018-03-21 DIAGNOSIS — E7800 Pure hypercholesterolemia, unspecified: Secondary | ICD-10-CM | POA: Diagnosis not present

## 2018-03-21 DIAGNOSIS — H2512 Age-related nuclear cataract, left eye: Secondary | ICD-10-CM | POA: Diagnosis not present

## 2018-04-09 ENCOUNTER — Other Ambulatory Visit: Payer: Self-pay | Admitting: Cardiology

## 2018-04-17 DIAGNOSIS — H2511 Age-related nuclear cataract, right eye: Secondary | ICD-10-CM | POA: Diagnosis not present

## 2018-04-17 DIAGNOSIS — H25011 Cortical age-related cataract, right eye: Secondary | ICD-10-CM | POA: Diagnosis not present

## 2018-04-25 DIAGNOSIS — H2511 Age-related nuclear cataract, right eye: Secondary | ICD-10-CM | POA: Diagnosis not present

## 2018-04-25 DIAGNOSIS — H25811 Combined forms of age-related cataract, right eye: Secondary | ICD-10-CM | POA: Diagnosis not present

## 2018-05-01 DIAGNOSIS — I509 Heart failure, unspecified: Secondary | ICD-10-CM | POA: Diagnosis not present

## 2018-05-01 DIAGNOSIS — E119 Type 2 diabetes mellitus without complications: Secondary | ICD-10-CM | POA: Diagnosis not present

## 2018-05-01 DIAGNOSIS — E7800 Pure hypercholesterolemia, unspecified: Secondary | ICD-10-CM | POA: Diagnosis not present

## 2018-05-16 DIAGNOSIS — J069 Acute upper respiratory infection, unspecified: Secondary | ICD-10-CM | POA: Diagnosis not present

## 2018-05-16 DIAGNOSIS — Z6826 Body mass index (BMI) 26.0-26.9, adult: Secondary | ICD-10-CM | POA: Diagnosis not present

## 2018-05-16 DIAGNOSIS — I509 Heart failure, unspecified: Secondary | ICD-10-CM | POA: Diagnosis not present

## 2018-05-16 DIAGNOSIS — R5383 Other fatigue: Secondary | ICD-10-CM | POA: Diagnosis not present

## 2018-05-16 DIAGNOSIS — I10 Essential (primary) hypertension: Secondary | ICD-10-CM | POA: Diagnosis not present

## 2018-05-16 DIAGNOSIS — Z1331 Encounter for screening for depression: Secondary | ICD-10-CM | POA: Diagnosis not present

## 2018-05-16 DIAGNOSIS — Z7189 Other specified counseling: Secondary | ICD-10-CM | POA: Diagnosis not present

## 2018-05-16 DIAGNOSIS — E1165 Type 2 diabetes mellitus with hyperglycemia: Secondary | ICD-10-CM | POA: Diagnosis not present

## 2018-05-16 DIAGNOSIS — Z1339 Encounter for screening examination for other mental health and behavioral disorders: Secondary | ICD-10-CM | POA: Diagnosis not present

## 2018-05-16 DIAGNOSIS — Z1211 Encounter for screening for malignant neoplasm of colon: Secondary | ICD-10-CM | POA: Diagnosis not present

## 2018-05-16 DIAGNOSIS — Z299 Encounter for prophylactic measures, unspecified: Secondary | ICD-10-CM | POA: Diagnosis not present

## 2018-05-16 DIAGNOSIS — Z0000 Encounter for general adult medical examination without abnormal findings: Secondary | ICD-10-CM | POA: Diagnosis not present

## 2018-05-17 DIAGNOSIS — Z79899 Other long term (current) drug therapy: Secondary | ICD-10-CM | POA: Diagnosis not present

## 2018-05-17 DIAGNOSIS — R5383 Other fatigue: Secondary | ICD-10-CM | POA: Diagnosis not present

## 2018-05-17 DIAGNOSIS — Z125 Encounter for screening for malignant neoplasm of prostate: Secondary | ICD-10-CM | POA: Diagnosis not present

## 2018-05-17 DIAGNOSIS — E7800 Pure hypercholesterolemia, unspecified: Secondary | ICD-10-CM | POA: Diagnosis not present

## 2018-05-26 DIAGNOSIS — E7800 Pure hypercholesterolemia, unspecified: Secondary | ICD-10-CM | POA: Diagnosis not present

## 2018-05-26 DIAGNOSIS — I509 Heart failure, unspecified: Secondary | ICD-10-CM | POA: Diagnosis not present

## 2018-05-26 DIAGNOSIS — E119 Type 2 diabetes mellitus without complications: Secondary | ICD-10-CM | POA: Diagnosis not present

## 2018-06-23 DIAGNOSIS — E7800 Pure hypercholesterolemia, unspecified: Secondary | ICD-10-CM | POA: Diagnosis not present

## 2018-06-23 DIAGNOSIS — E119 Type 2 diabetes mellitus without complications: Secondary | ICD-10-CM | POA: Diagnosis not present

## 2018-06-23 DIAGNOSIS — I509 Heart failure, unspecified: Secondary | ICD-10-CM | POA: Diagnosis not present

## 2018-06-29 ENCOUNTER — Ambulatory Visit (INDEPENDENT_AMBULATORY_CARE_PROVIDER_SITE_OTHER): Payer: Medicare Other | Admitting: Internal Medicine

## 2018-07-21 DIAGNOSIS — E7800 Pure hypercholesterolemia, unspecified: Secondary | ICD-10-CM | POA: Diagnosis not present

## 2018-07-21 DIAGNOSIS — I509 Heart failure, unspecified: Secondary | ICD-10-CM | POA: Diagnosis not present

## 2018-07-21 DIAGNOSIS — E119 Type 2 diabetes mellitus without complications: Secondary | ICD-10-CM | POA: Diagnosis not present

## 2018-07-24 ENCOUNTER — Ambulatory Visit (INDEPENDENT_AMBULATORY_CARE_PROVIDER_SITE_OTHER): Payer: Medicare Other | Admitting: Internal Medicine

## 2018-07-24 ENCOUNTER — Encounter (INDEPENDENT_AMBULATORY_CARE_PROVIDER_SITE_OTHER): Payer: Self-pay | Admitting: Internal Medicine

## 2018-07-24 VITALS — BP 128/57 | HR 52 | Temp 94.6°F | Ht 68.0 in | Wt 164.9 lb

## 2018-07-24 DIAGNOSIS — R1310 Dysphagia, unspecified: Secondary | ICD-10-CM

## 2018-07-24 DIAGNOSIS — R1319 Other dysphagia: Secondary | ICD-10-CM

## 2018-07-24 NOTE — Patient Instructions (Signed)
Continue the Protonix. OV in 1 year.  

## 2018-07-24 NOTE — Progress Notes (Signed)
Subjective:    Patient ID: John Moore, male    DOB: 05/30/1928, 85 y.o.   MRN: [id number]  HPIHere today for follow. Last seen January of 2019. Hx of dysphagia.  He tells me he is doing good. He lives by himself. His appetite is good.  No dysphagia.  His BMs move okay.  He eats at his daughter's or his daughter cooks his meals. He also does takeout. BMs x 1 a day. No melena.   Hx of CAD,Diabetes hypertension.  Widowed.   06/27/2014: EGD with ED  Indications: Patient is 85 year old Caucasian male was chronic GERD complicated by distal esophageal stricture was dilated in March 2013 and he now presents with intermittent solid food dysphagia. Heartburn is well controlled with therapy.  Impression: Ringlike stricture at GE junction dilated by passing 69 [place] Maloney dilator. Small sliding hiatal hernia.   Procedure Date: 08/26/2011 Procedure: EGD with ED. Indications: Patient is an 85 year old Caucasian male who underwent EGD back in October 2012 have ulcerative esophagitis with a high-grade stricture which was dilated to 16.5 mm with a balloon. Patient was advised to return for office visit but he did not. He felt so much better he decided to cut his medication. However now he has developed intermittent solid food dysphagia. He is therefore undergoing repeat dilation. Impression: Healed esophagitis. Stricture at GE junction and dilated with a balloon to 18 mm. Small sliding hiatal hernia.   Review of Systems Past Medical History:  Diagnosis Date  . ACE inhibitor intolerance    Tolerates ARB  . Allergic reaction to contrast dye   . CAD (coronary artery disease)    Multivessel status post CABG 2008  . Chronic systolic heart failure ([place])   .  CKD (chronic kidney disease) stage 3, GFR 30-59 ml/min (HCC)   . Diverticulitis   . Dyslipidemia   . Dysphagia   . Essential hypertension   . GERD (gastroesophageal reflux disease)   . GI bleeding 2002  . Ischemic cardiomyopathy   . Leg cramps    Nocturnal  . Postoperative atrial fibrillation ([place])   . Prostate disorder   . Type 2 diabetes mellitus ([place])     Past Surgical History:  Procedure Laterality Date  . COLONOSCOPY  04/2001  . CORONARY ARTERY BYPASS GRAFT  05/2007   LIMA to LAD, SVG to diagonal, SVG to OM, SVG to RCA  . ESOPHAGOGASTRODUODENOSCOPY N/A 06/27/2014   Procedure: ESOPHAGOGASTRODUODENOSCOPY (EGD);  Surgeon: Rogene Houston, MD;  [facility];  Service: Endoscopy;  Laterality: N/A;  830  . MALONEY DILATION N/A 06/27/2014   Procedure: Venia Minks DILATION;  Surgeon: Rogene Houston, MD;  [facility];  Service: Endoscopy;  Laterality: N/A;  . MYRINFOPLASTY    . ROTATOR CUFF REPAIR    . TONSILLECTOMY      Allergies  Allergen Reactions  . Ace Inhibitors Other (See Comments)    unknown  . Nsaids Other (See Comments)    unknown  . Iodinated Diagnostic Agents Rash    Current Outpatient Medications on File Prior to Visit  Medication Sig Dispense Refill  . amLODipine (NORVASC) 10 MG tablet TAKE 1 TABLET BY MOUTH EVERY DAY 90 tablet 3  . aspirin EC 81 MG tablet Take 81 mg by mouth daily.    . carvedilol (COREG) 25 MG tablet TAKE 1/2 TABLET BY MOUTH TWICE DAILY PLEASE CUT IN HALF 90 tablet 1  . Cholecalciferol (VITAMIN D3 PO) Take 1 tablet by mouth daily.    Marland Kitchen  furosemide (LASIX) 40 MG tablet Take 1 tablet by mouth 2 (two) times daily.    Marland Kitchen glipiZIDE (GLUCOTROL) 5 MG tablet Take by mouth 3 (three) times daily.    . hydrALAZINE (APRESOLINE) 25 MG tablet Take 1 tablet by mouth 2 (two) times daily.    Marland Kitchen linagliptin (TRADJENTA) 5 MG TABS tablet Take 5 mg by mouth daily.    . magnesium oxide (MAG-OX) 400 MG tablet Take 400 mg by mouth daily.    .  Multiple Vitamin (MULTIVITAMIN) tablet Take 1 tablet by mouth daily.    . nitroGLYCERIN (NITROSTAT) 0.4 MG SL tablet Place 1 tablet (0.4 mg total) under the tongue every 5 (five) minutes as needed. For chest pains 25 tablet 3  . olmesartan (BENICAR) 40 MG tablet Take 1 tablet (40 mg total) by mouth daily. 90 tablet 3  . pantoprazole (PROTONIX) 40 MG tablet TAKE 1 TABLET BY MOUTH DAILY. 90 tablet 3  . potassium chloride (MICRO-K) 10 MEQ CR capsule Take 10 mEq by mouth daily.     . simvastatin (ZOCOR) 20 MG tablet Take 1 tablet (20 mg total) by mouth at bedtime. 90 tablet 3   No current facility-administered medications on file prior to visit.         Objective:   Physical Exam Blood pressure (!) 128/57, pulse (!) 52, temperature (!) 94.6 F (34.8 C), height 5\' 8"  (1.727 m), weight 164 lb 14.4 oz (74.8 kg). Alert and oriented. Skin warm and dry. Oral mucosa is moist.   . Sclera anicteric, conjunctivae is pink. Thyroid not enlarged. No cervical lymphadenopathy. Lungs clear. Heart regular rate and rhythm.  Abdomen is soft. Bowel sounds are positive. No hepatomegaly. No abdominal masses felt. No tenderness.  No edema to lower extremities.           Assessment & Plan:  Dysphagia. He is doing well. He will continue the Protonix.  OV in 1 year.

## 2018-08-07 ENCOUNTER — Other Ambulatory Visit: Payer: Self-pay | Admitting: Cardiology

## 2018-08-22 DIAGNOSIS — I10 Essential (primary) hypertension: Secondary | ICD-10-CM | POA: Diagnosis not present

## 2018-08-22 DIAGNOSIS — Z6825 Body mass index (BMI) 25.0-25.9, adult: Secondary | ICD-10-CM | POA: Diagnosis not present

## 2018-08-22 DIAGNOSIS — N183 Chronic kidney disease, stage 3 (moderate): Secondary | ICD-10-CM | POA: Diagnosis not present

## 2018-08-22 DIAGNOSIS — I509 Heart failure, unspecified: Secondary | ICD-10-CM | POA: Diagnosis not present

## 2018-08-22 DIAGNOSIS — E1122 Type 2 diabetes mellitus with diabetic chronic kidney disease: Secondary | ICD-10-CM | POA: Diagnosis not present

## 2018-08-22 DIAGNOSIS — E1165 Type 2 diabetes mellitus with hyperglycemia: Secondary | ICD-10-CM | POA: Diagnosis not present

## 2018-08-22 DIAGNOSIS — Z299 Encounter for prophylactic measures, unspecified: Secondary | ICD-10-CM | POA: Diagnosis not present

## 2018-09-07 DIAGNOSIS — E119 Type 2 diabetes mellitus without complications: Secondary | ICD-10-CM | POA: Diagnosis not present

## 2018-09-07 DIAGNOSIS — I509 Heart failure, unspecified: Secondary | ICD-10-CM | POA: Diagnosis not present

## 2018-09-07 DIAGNOSIS — E7800 Pure hypercholesterolemia, unspecified: Secondary | ICD-10-CM | POA: Diagnosis not present

## 2018-09-21 ENCOUNTER — Telehealth: Payer: Self-pay | Admitting: *Deleted

## 2018-09-21 ENCOUNTER — Encounter: Payer: Self-pay | Admitting: *Deleted

## 2018-09-21 NOTE — Telephone Encounter (Signed)
Medications and allergies reviewed with patient's daughter Aware to check BP, HR and weight prior to visit New lab work with PCP and requested. No ED or hospitalizations since last visit  Patient daughter verbally consented for telehealth visits with [facility] and understands that his insurance company will be billed for the encounter.

## 2018-09-26 ENCOUNTER — Telehealth: Payer: Self-pay | Admitting: Cardiology

## 2018-09-26 NOTE — Telephone Encounter (Signed)
° ° °  Virtual Visit Pre-Appointment Phone Call  Steps For Call:  1. Confirm consent - "In the setting of the current Covid19 crisis, you are scheduled for a (phone or video) visit with your provider on (date) at (time).  Just as we do with many in-office visits, in order for you to participate in this visit, we must obtain consent.  If you'd like, I can send this to your mychart (if signed up) or email for you to review.  Otherwise, I can obtain your verbal consent now.  All virtual visits are billed to your insurance company just like a normal visit would be.  By agreeing to a virtual visit, we'd like you to understand that the technology does not allow for your provider to perform an examination, and thus may limit your provider's ability to fully assess your condition. If your provider identifies any concerns that need to be evaluated in person, we will make arrangements to do so.  Finally, though the technology is pretty good, we cannot assure that it will always work on either your or our end, and in the setting of a video visit, we may have to convert it to a phone-only visit.  In either situation, we cannot ensure that we have a secure connection.  Are you willing to proceed?" STAFF: Did the patient verbally acknowledge consent to telehealth visit? Document YES/NO here: Yes  2. Confirm the BEST phone number to call the day of the visit by including in appointment notes  3. Give patient instructions for MyChart download to smartphone OR [email or web address] as below if video visit (depending on what platform provider is using)  4. Confirm that appointment type is correct in Epic appointment notes (VIDEO vs PHONE)  5. Advise patient to be prepared with their blood pressure, heart rate, weight, any heart rhythm information, their current medicines, and a piece of paper and pen handy for any instructions they may receive the day of their visit  6. Inform patient they will receive a phone call 15 minutes  prior to their appointment time (may be from unknown caller ID) so they should be prepared to answer    [place] has been deemed a candidate for a follow-up tele-health visit to limit community exposure during the Covid-19 pandemic. I spoke with the patient via phone to ensure availability of phone/video source, confirm preferred email & phone number, and discuss instructions and expectations.  I reminded Rondal Vandevelde Lemke to be prepared with any vital sign and/or heart rhythm information that could potentially be obtained via home monitoring, at the time of his visit. I reminded Shiven Junious Hillhouse to expect a phone call prior to his visit.  Orinda Kenner 09/26/2018 8:49 AM

## 2018-09-26 NOTE — Progress Notes (Signed)
Virtual Visit via Telephone Note   This visit type was conducted due to national recommendations for restrictions regarding the COVID-19 Pandemic (e.g. social distancing) in an effort to limit this patient's exposure and mitigate transmission in our community.  Due to his co-morbid illnesses, this patient is at least at moderate risk for complications without adequate follow up.  This format is felt to be most appropriate for this patient at this time.  The patient did not have access to video technology/had technical difficulties with video requiring transitioning to audio format only (telephone).  All issues noted in this document were discussed and addressed.  No physical exam could be performed with this format.  Please refer to the patient's chart for his  consent to telehealth for [facility].   Evaluation Performed:  Follow-up visit  Date:  09/27/2018   ID:  John Moore, DOB 04-29-1928, MRN [id number]  Patient Location: Home Provider Location: Office  PCP:  Glenda Chroman, MD  Cardiologist:  Rozann Lesches, MD  Chief Complaint:  Follow-up CAD and symptom control  History of Present Illness:    John Moore is an 85 y.o. male last seen in October 2019.  He did not have video access and we spoke by phone today.  He tells me that he has had no angina symptoms or use nitroglycerin since last visit.  He is staying around the house during the current pandemic, gets out to the grocery store occasionally.  He reports NYHA class II dyspnea, no orthopnea or PND.  He reports that his weight has been stable as well.  I reviewed his current medications which are listed below.  He remains on stable diuretic regimen.  He continues to see Dr. Woody Seller on a 72-month basis.  We will continue to manage his cardiac status conservatively, particularly in the absence of accelerating angina symptoms.  The patient does not have symptoms concerning for COVID-19 infection (fever, chills, cough, or new  shortness of breath).    Past Medical History:  Diagnosis Date   ACE inhibitor intolerance    Tolerates ARB   Allergic reaction to contrast dye    CAD (coronary artery disease)    Multivessel status post CABG 7628   Chronic systolic heart failure (HCC)    CKD (chronic kidney disease) stage 3, GFR 30-59 ml/min (HCC)    Diverticulitis    Dyslipidemia    Dysphagia    Essential hypertension    GERD (gastroesophageal reflux disease)    GI bleeding 2002   Ischemic cardiomyopathy    Leg cramps    Nocturnal   Postoperative atrial fibrillation (HCC)    Prostate disorder    Type 2 diabetes mellitus ([place])    Past Surgical History:  Procedure Laterality Date   COLONOSCOPY  04/2001   CORONARY ARTERY BYPASS GRAFT  05/2007   LIMA to LAD, SVG to diagonal, SVG to OM, SVG to RCA   ESOPHAGOGASTRODUODENOSCOPY N/A 06/27/2014   Procedure: ESOPHAGOGASTRODUODENOSCOPY (EGD);  Surgeon: Rogene Houston, MD;  [facility];  Service: Endoscopy;  Laterality: N/A;  [place] N/A 06/27/2014   Procedure: Venia Minks DILATION;  Surgeon: Rogene Houston, MD;  [facility];  Service: Endoscopy;  Laterality: N/A;   MYRINFOPLASTY     ROTATOR CUFF REPAIR     TONSILLECTOMY       Current Meds  Medication Sig   amLODipine (NORVASC) 10 MG tablet TAKE 1 TABLET BY MOUTH EVERY DAY  aspirin EC 81 MG tablet Take 81 mg by mouth daily.   carvedilol (COREG) 25 MG tablet TAKE 1/2 TABLET BY MOUTH TWICE DAILY PLEASE CUT IN HALF   Cholecalciferol (VITAMIN D3 PO) Take 1 tablet by mouth daily.   furosemide (LASIX) 40 MG tablet Take 1 tablet by mouth 2 (two) times daily.   glipiZIDE (GLUCOTROL) 5 MG tablet Take by mouth 3 (three) times daily.   hydrALAZINE (APRESOLINE) 25 MG tablet Take 1 tablet by mouth 2 (two) times daily.   linagliptin (TRADJENTA) 5 MG TABS tablet Take 5 mg by mouth daily.   magnesium oxide (MAG-OX) 400 MG tablet Take 400 mg by mouth daily.    Multiple Vitamin (MULTIVITAMIN) tablet Take 1 tablet by mouth daily.   nitroGLYCERIN (NITROSTAT) 0.4 MG SL tablet Place 1 tablet (0.4 mg total) under the tongue every 5 (five) minutes as needed. For chest pains   olmesartan (BENICAR) 40 MG tablet Take 1 tablet (40 mg total) by mouth daily.   pantoprazole (PROTONIX) 40 MG tablet TAKE 1 TABLET BY MOUTH DAILY.   potassium chloride (MICRO-K) 10 MEQ CR capsule Take 10 mEq by mouth daily.    simvastatin (ZOCOR) 20 MG tablet Take 1 tablet (20 mg total) by mouth at bedtime.     Allergies:   Ace inhibitors; Nsaids; and Iodinated diagnostic agents   Social History   Tobacco Use   Smoking status: Former Smoker    Packs/day: 0.30    Years: 30.00    Pack years: 9.00    Types: Cigarettes    Start date: 06/07/1948    Last attempt to quit: 06/07/1978    Years since quitting: 40.3   Smokeless tobacco: Never Used   Tobacco comment: 40 yrs ago  Substance Use Topics   Alcohol use: No    Alcohol/week: 0.0 standard drinks   Drug use: No     Family Hx: The patient's family history includes Heart attack in his mother.  ROS:   Please see the history of present illness.    Chronic hearing loss. All other systems reviewed and are negative.   Prior CV studies:   The following studies were reviewed today:  Echocardiogram 01/08/2010 [facility]):  Mildly dilated LV with LVEF 20-25%, restrictive diastolic filling pattern with increased filling pressures, moderate left atrial enlargement, mildly reduced RV contraction, mild right atrial enlargement, sclerotic aortic valve, mild to moderate mitral regurgitation,mild tricuspid regurgitation, severe pulmonary hypertension with RVSP 63 mmHg.  Labs/Other Tests and Data Reviewed:    EKG:  An ECG dated 09/21/2017 was personally reviewed today and demonstrated:  Atrial flutter with variable conduction, IVCD with poor R wave progression.  Recent Labs:  December 2019: Hemoglobin 14.3, platelets 214, TSH  2.55, cholesterol 87, triglycerides 78, HDL 35, LDL 35, BUN 60, creatinine 2.49, potassium 5.0, AST 16, ALT 16  Wt Readings from Last 3 Encounters:  09/27/18 165 lb (74.8 kg)  07/24/18 164 lb 14.4 oz (74.8 kg)  03/20/18 166 lb 9.6 oz (75.6 kg)     Objective:    Vital Signs:  Pulse (!) 52    Ht 5\' 8"  (1.727 m)    Wt 165 lb (74.8 kg)    SpO2 98%    BMI 25.09 kg/m    He did not check his blood pressure today. He answered questions spontaneously on the phone.  Voice tone normal as was speech pattern.  He was not short of breath while speaking in full sentences.  ASSESSMENT & PLAN:  1.  Multivessel CAD status post CABG in 2008.  He denies any active angina at this time.  He is on aspirin, statin, beta-blocker, and Norvasc.  2.  Ischemic cardiomyopathy with LVEF 20 to 25% range.  Plan is to continue medical therapy and he has been clinically stable in terms of volume status.  Not a candidate for ICD or advanced therapies.  COVID-19 Education: The signs and symptoms of COVID-19 were discussed with the patient and how to seek care for testing (follow up with PCP or arrange E-visit).  The importance of social distancing was discussed today.  Time:   Today, I have spent 5 minutes with the patient with telehealth technology discussing the above problems.     Medication Adjustments/Labs and Tests Ordered: Current medicines are reviewed at length with the patient today.  Concerns regarding medicines are outlined above.   Tests Ordered: No orders of the defined types were placed in this encounter.   Medication Changes: No orders of the defined types were placed in this encounter.   Disposition:  Follow up 6 months in [place] office.  Signed, Rozann Lesches, MD  09/27/2018 3:49 PM    [place]

## 2018-09-27 ENCOUNTER — Encounter: Payer: Self-pay | Admitting: Cardiology

## 2018-09-27 ENCOUNTER — Telehealth (INDEPENDENT_AMBULATORY_CARE_PROVIDER_SITE_OTHER): Payer: Medicare Other | Admitting: Cardiology

## 2018-09-27 VITALS — HR 52 | Ht 68.0 in | Wt 165.0 lb

## 2018-09-27 DIAGNOSIS — I2510 Atherosclerotic heart disease of native coronary artery without angina pectoris: Secondary | ICD-10-CM | POA: Diagnosis not present

## 2018-09-27 DIAGNOSIS — Z7189 Other specified counseling: Secondary | ICD-10-CM

## 2018-09-27 DIAGNOSIS — N183 Chronic kidney disease, stage 3 unspecified: Secondary | ICD-10-CM

## 2018-09-27 DIAGNOSIS — I255 Ischemic cardiomyopathy: Secondary | ICD-10-CM

## 2018-09-27 NOTE — Patient Instructions (Addendum)

## 2018-10-04 ENCOUNTER — Other Ambulatory Visit: Payer: Self-pay | Admitting: Cardiology

## 2018-10-10 DIAGNOSIS — E7800 Pure hypercholesterolemia, unspecified: Secondary | ICD-10-CM | POA: Diagnosis not present

## 2018-10-10 DIAGNOSIS — I509 Heart failure, unspecified: Secondary | ICD-10-CM | POA: Diagnosis not present

## 2018-10-10 DIAGNOSIS — E119 Type 2 diabetes mellitus without complications: Secondary | ICD-10-CM | POA: Diagnosis not present

## 2018-10-18 ENCOUNTER — Other Ambulatory Visit (INDEPENDENT_AMBULATORY_CARE_PROVIDER_SITE_OTHER): Payer: Self-pay | Admitting: Internal Medicine

## 2018-11-07 DIAGNOSIS — E7800 Pure hypercholesterolemia, unspecified: Secondary | ICD-10-CM | POA: Diagnosis not present

## 2018-11-07 DIAGNOSIS — E119 Type 2 diabetes mellitus without complications: Secondary | ICD-10-CM | POA: Diagnosis not present

## 2018-11-07 DIAGNOSIS — I509 Heart failure, unspecified: Secondary | ICD-10-CM | POA: Diagnosis not present

## 2018-11-28 DIAGNOSIS — N183 Chronic kidney disease, stage 3 (moderate): Secondary | ICD-10-CM | POA: Diagnosis not present

## 2018-11-28 DIAGNOSIS — I10 Essential (primary) hypertension: Secondary | ICD-10-CM | POA: Diagnosis not present

## 2018-11-28 DIAGNOSIS — E1122 Type 2 diabetes mellitus with diabetic chronic kidney disease: Secondary | ICD-10-CM | POA: Diagnosis not present

## 2018-11-28 DIAGNOSIS — Z6826 Body mass index (BMI) 26.0-26.9, adult: Secondary | ICD-10-CM | POA: Diagnosis not present

## 2018-11-28 DIAGNOSIS — E1165 Type 2 diabetes mellitus with hyperglycemia: Secondary | ICD-10-CM | POA: Diagnosis not present

## 2018-11-28 DIAGNOSIS — Z299 Encounter for prophylactic measures, unspecified: Secondary | ICD-10-CM | POA: Diagnosis not present

## 2018-11-28 DIAGNOSIS — I509 Heart failure, unspecified: Secondary | ICD-10-CM | POA: Diagnosis not present

## 2018-12-18 DIAGNOSIS — I509 Heart failure, unspecified: Secondary | ICD-10-CM | POA: Diagnosis not present

## 2018-12-18 DIAGNOSIS — E7800 Pure hypercholesterolemia, unspecified: Secondary | ICD-10-CM | POA: Diagnosis not present

## 2018-12-18 DIAGNOSIS — E119 Type 2 diabetes mellitus without complications: Secondary | ICD-10-CM | POA: Diagnosis not present

## 2019-02-08 DIAGNOSIS — E119 Type 2 diabetes mellitus without complications: Secondary | ICD-10-CM | POA: Diagnosis not present

## 2019-02-08 DIAGNOSIS — I509 Heart failure, unspecified: Secondary | ICD-10-CM | POA: Diagnosis not present

## 2019-02-08 DIAGNOSIS — E7800 Pure hypercholesterolemia, unspecified: Secondary | ICD-10-CM | POA: Diagnosis not present

## 2019-02-27 DIAGNOSIS — L905 Scar conditions and fibrosis of skin: Secondary | ICD-10-CM | POA: Diagnosis not present

## 2019-02-27 DIAGNOSIS — Z85828 Personal history of other malignant neoplasm of skin: Secondary | ICD-10-CM | POA: Diagnosis not present

## 2019-02-27 DIAGNOSIS — D485 Neoplasm of uncertain behavior of skin: Secondary | ICD-10-CM | POA: Diagnosis not present

## 2019-02-28 DIAGNOSIS — C4442 Squamous cell carcinoma of skin of scalp and neck: Secondary | ICD-10-CM | POA: Diagnosis not present

## 2019-03-06 DIAGNOSIS — Z299 Encounter for prophylactic measures, unspecified: Secondary | ICD-10-CM | POA: Diagnosis not present

## 2019-03-06 DIAGNOSIS — I10 Essential (primary) hypertension: Secondary | ICD-10-CM | POA: Diagnosis not present

## 2019-03-06 DIAGNOSIS — Z6826 Body mass index (BMI) 26.0-26.9, adult: Secondary | ICD-10-CM | POA: Diagnosis not present

## 2019-03-06 DIAGNOSIS — N183 Chronic kidney disease, stage 3 (moderate): Secondary | ICD-10-CM | POA: Diagnosis not present

## 2019-03-06 DIAGNOSIS — Z23 Encounter for immunization: Secondary | ICD-10-CM | POA: Diagnosis not present

## 2019-03-06 DIAGNOSIS — I509 Heart failure, unspecified: Secondary | ICD-10-CM | POA: Diagnosis not present

## 2019-03-06 DIAGNOSIS — E1122 Type 2 diabetes mellitus with diabetic chronic kidney disease: Secondary | ICD-10-CM | POA: Diagnosis not present

## 2019-03-06 DIAGNOSIS — E1165 Type 2 diabetes mellitus with hyperglycemia: Secondary | ICD-10-CM | POA: Diagnosis not present

## 2019-03-09 DIAGNOSIS — L570 Actinic keratosis: Secondary | ICD-10-CM | POA: Diagnosis not present

## 2019-03-09 DIAGNOSIS — D044 Carcinoma in situ of skin of scalp and neck: Secondary | ICD-10-CM | POA: Diagnosis not present

## 2019-03-23 DIAGNOSIS — E119 Type 2 diabetes mellitus without complications: Secondary | ICD-10-CM | POA: Diagnosis not present

## 2019-03-23 DIAGNOSIS — E7800 Pure hypercholesterolemia, unspecified: Secondary | ICD-10-CM | POA: Diagnosis not present

## 2019-03-23 DIAGNOSIS — I509 Heart failure, unspecified: Secondary | ICD-10-CM | POA: Diagnosis not present

## 2019-04-03 DIAGNOSIS — E119 Type 2 diabetes mellitus without complications: Secondary | ICD-10-CM | POA: Diagnosis not present

## 2019-04-05 ENCOUNTER — Telehealth: Payer: Medicare Other | Admitting: Cardiology

## 2019-04-23 ENCOUNTER — Other Ambulatory Visit: Payer: Self-pay

## 2019-04-23 ENCOUNTER — Encounter: Payer: Self-pay | Admitting: Cardiology

## 2019-04-23 ENCOUNTER — Ambulatory Visit (INDEPENDENT_AMBULATORY_CARE_PROVIDER_SITE_OTHER): Payer: Medicare Other | Admitting: Cardiology

## 2019-04-23 VITALS — BP 130/64 | Ht 68.5 in | Wt 163.0 lb

## 2019-04-23 DIAGNOSIS — I255 Ischemic cardiomyopathy: Secondary | ICD-10-CM

## 2019-04-23 DIAGNOSIS — I2510 Atherosclerotic heart disease of native coronary artery without angina pectoris: Secondary | ICD-10-CM

## 2019-04-23 NOTE — Progress Notes (Signed)
Cardiology Office Note  Date: 04/23/2019   John Moore, DOB January 18, 1928, MRN [id number]  PCP:  Glenda Chroman, MD  Cardiologist:  Rozann Lesches, MD Electrophysiologist:  None   Chief Complaint  Patient presents with  . Cardiac follow-up    History of Present Illness: John Moore is an 85 y.o. male last assessed via telehealth encounter in April.  He is here today with his daughter for follow-up visit.  He will be celebrating his 89th birthday on Wednesday.  Reports no major change in status, fairly sedentary, his daughter states that he watches a lot of television at home.  He does not describe any angina symptoms or nitroglycerin use.  No palpitations.  I personally reviewed his ECG today which shows atrial flutter versus ectopic atrial tachycardia with 4:1 block and IVCD of left bundle branch block type.  This has been a persistent arrhythmia, currently not anticoagulated with previous history of significant GI bleed.  He has tolerated aspirin as part of his therapy for ischemic heart disease.  I reviewed his medications which are listed below.  He reports no obvious intolerances.  Continues to follow lab work with Dr. Woody Seller.  Past Medical History:  Diagnosis Date  . ACE inhibitor intolerance    Tolerates ARB  . Allergic reaction to contrast dye   . CAD (coronary artery disease)    Multivessel status post CABG 2008  . Chronic systolic heart failure ([place])   . CKD (chronic kidney disease) stage 3, GFR 30-59 ml/min   . Diverticulitis   . Dyslipidemia   . Dysphagia   . Essential hypertension   . GERD (gastroesophageal reflux disease)   . GI bleeding 2002  . Ischemic cardiomyopathy   . Leg cramps    Nocturnal  . Postoperative atrial fibrillation ([place])   . Prostate disorder   . Type 2 diabetes mellitus ([place])     Past Surgical History:  Procedure Laterality Date  . COLONOSCOPY  04/2001  . CORONARY ARTERY BYPASS GRAFT  05/2007   LIMA to LAD, SVG to diagonal,  SVG to OM, SVG to RCA  . ESOPHAGOGASTRODUODENOSCOPY N/A 06/27/2014   Procedure: ESOPHAGOGASTRODUODENOSCOPY (EGD);  Surgeon: Rogene Houston, MD;  [facility];  Service: Endoscopy;  Laterality: N/A;  830  . MALONEY DILATION N/A 06/27/2014   Procedure: Venia Minks DILATION;  Surgeon: Rogene Houston, MD;  [facility];  Service: Endoscopy;  Laterality: N/A;  . MYRINFOPLASTY    . ROTATOR CUFF REPAIR    . TONSILLECTOMY      Current Outpatient Medications  Medication Sig Dispense Refill  . amLODipine (NORVASC) 10 MG tablet TAKE 1 TABLET BY MOUTH EVERY DAY 90 tablet 3  . aspirin EC 81 MG tablet Take 81 mg by mouth daily.    . carvedilol (COREG) 25 MG tablet TAKE 1/2 TABLET BY MOUTH TWICE DAILY PLEASE CUT IN HALF 90 tablet 3  . Cholecalciferol (VITAMIN D3 PO) Take 1 tablet by mouth daily.    . furosemide (LASIX) 40 MG tablet Take 1 tablet by mouth 2 (two) times daily.    Marland Kitchen glipiZIDE (GLUCOTROL) 5 MG tablet Take by mouth 3 (three) times daily.    . hydrALAZINE (APRESOLINE) 25 MG tablet Take 1 tablet by mouth 2 (two) times daily.    Marland Kitchen linagliptin (TRADJENTA) 5 MG TABS tablet Take 5 mg by mouth daily.    . magnesium oxide (MAG-OX) 400 MG tablet Take 400 mg by mouth daily.    Marland Kitchen  Multiple Vitamin (MULTIVITAMIN) tablet Take 1 tablet by mouth daily.    . nitroGLYCERIN (NITROSTAT) 0.4 MG SL tablet Place 1 tablet (0.4 mg total) under the tongue every 5 (five) minutes as needed. For chest pains 25 tablet 3  . olmesartan (BENICAR) 40 MG tablet Take 1 tablet (40 mg total) by mouth daily. 90 tablet 3  . pantoprazole (PROTONIX) 40 MG tablet TAKE 1 TABLET BY MOUTH DAILY. 90 tablet 3  . potassium chloride (MICRO-K) 10 MEQ CR capsule Take 10 mEq by mouth daily.     . simvastatin (ZOCOR) 20 MG tablet Take 1 tablet (20 mg total) by mouth at bedtime. 90 tablet 3   No current facility-administered medications for this visit.    Allergies:  Ace inhibitors, Nsaids, and Iodinated diagnostic agents    Social History: The patient  reports that he quit smoking about 40 years ago. His smoking use included cigarettes. He started smoking about 70 years ago. He has a 9.00 pack-year smoking history. He has never used smokeless tobacco. He reports that he does not drink alcohol or use drugs.   ROS:  Please see the history of present illness. Otherwise, complete review of systems is positive for hearing loss.  All other systems are reviewed and negative.   Physical Exam: VS:  BP 130/64   Ht 5' 8.5" (1.74 m)   Wt 163 lb (73.9 kg)   SpO2 97%   BMI 24.42 kg/m , BMI Body mass index is 24.42 kg/m.  Wt Readings from Last 3 Encounters:  04/23/19 163 lb (73.9 kg)  09/27/18 165 lb (74.8 kg)  07/24/18 164 lb 14.4 oz (74.8 kg)    General: Elderly male, appears comfortable at rest. HEENT: Conjunctiva and lids normal, wearing a mask. Neck: Supple, no elevated JVP or carotid bruits, no thyromegaly. Lungs: Clear to auscultation, nonlabored breathing at rest. Cardiac: Regular rate and rhythm, no S3, soft systolic murmur. Abdomen: Soft, nontender, bowel sounds present. Extremities: No pitting edema, distal pulses 2+. Skin: Warm and dry. Musculoskeletal: No kyphosis. Neuropsychiatric: Alert and oriented x3, affect grossly appropriate.  ECG:  An ECG dated 09/21/2017 was personally reviewed today and demonstrated:  Atrial flutter with variable conduction, IVCD with poor R wave progression.  Recent Labwork:  December 2019: Hemoglobin 14.3, platelets 214, TSH 2.55, cholesterol 87, triglycerides 78, HDL 35, LDL 35, BUN 60, creatinine 2.49, potassium 5.0, AST 16, ALT 16  Other Studies Reviewed Today:  Echocardiogram 01/08/2010 [facility]):  Mildly dilated LV with LVEF 20-25%, restrictive diastolic filling pattern with increased filling pressures, moderate left atrial enlargement, mildly reduced RV contraction, mild right atrial enlargement, sclerotic aortic valve, mild to moderate mitral regurgitation,mild  tricuspid regurgitation, severe pulmonary hypertension with RVSP 63 mmHg.  Assessment and Plan:  1.  Multivessel CAD status post CABG in 2008.  Would continue medical therapy and observation with overall conservative management planned. Continue aspirin, Norvasc, Coreg, Benicar, and Zocor.  2.  Atrial flutter versus ectopic atrial tachycardia with 4:1 block.  This has been persistent and he is asymptomatic.  Not anticoagulated with prior history of significant GI bleeding.  3.  Ischemic cardiomyopathy with LVEF 20 to 25% range by remote testing.  He is not an active candidate for ICD and we have not reimaged given plan for conservative management at this point.  Medication Adjustments/Labs and Tests Ordered: Current medicines are reviewed at length with the patient today.  Concerns regarding medicines are outlined above.   Tests Ordered: Orders Placed This Encounter  Procedures  .  EKG 12-Lead    Medication Changes: No orders of the defined types were placed in this encounter.   Disposition:  Follow up 6 months in the [place] office.  Signed, Satira Sark, MD, [facility] 04/23/2019 3:14 PM    [facility] [facility] [place], [place],  [address] Phone: [phone]; Fax: [phone]

## 2019-04-23 NOTE — Patient Instructions (Addendum)

## 2019-04-24 DIAGNOSIS — E11319 Type 2 diabetes mellitus with unspecified diabetic retinopathy without macular edema: Secondary | ICD-10-CM | POA: Diagnosis not present

## 2019-04-30 DIAGNOSIS — C4442 Squamous cell carcinoma of skin of scalp and neck: Secondary | ICD-10-CM | POA: Diagnosis not present

## 2019-06-05 DIAGNOSIS — E119 Type 2 diabetes mellitus without complications: Secondary | ICD-10-CM | POA: Diagnosis not present

## 2019-06-20 DIAGNOSIS — E7800 Pure hypercholesterolemia, unspecified: Secondary | ICD-10-CM | POA: Diagnosis not present

## 2019-06-20 DIAGNOSIS — E119 Type 2 diabetes mellitus without complications: Secondary | ICD-10-CM | POA: Diagnosis not present

## 2019-06-20 DIAGNOSIS — I509 Heart failure, unspecified: Secondary | ICD-10-CM | POA: Diagnosis not present

## 2019-06-26 DIAGNOSIS — S0100XD Unspecified open wound of scalp, subsequent encounter: Secondary | ICD-10-CM | POA: Diagnosis not present

## 2019-06-27 ENCOUNTER — Other Ambulatory Visit: Payer: Self-pay | Admitting: Cardiology

## 2019-07-02 DIAGNOSIS — E1165 Type 2 diabetes mellitus with hyperglycemia: Secondary | ICD-10-CM | POA: Diagnosis not present

## 2019-07-02 DIAGNOSIS — I10 Essential (primary) hypertension: Secondary | ICD-10-CM | POA: Diagnosis not present

## 2019-07-02 DIAGNOSIS — N183 Chronic kidney disease, stage 3 unspecified: Secondary | ICD-10-CM | POA: Diagnosis not present

## 2019-07-02 DIAGNOSIS — I509 Heart failure, unspecified: Secondary | ICD-10-CM | POA: Diagnosis not present

## 2019-07-02 DIAGNOSIS — Z6826 Body mass index (BMI) 26.0-26.9, adult: Secondary | ICD-10-CM | POA: Diagnosis not present

## 2019-07-02 DIAGNOSIS — Z299 Encounter for prophylactic measures, unspecified: Secondary | ICD-10-CM | POA: Diagnosis not present

## 2019-07-02 DIAGNOSIS — E1122 Type 2 diabetes mellitus with diabetic chronic kidney disease: Secondary | ICD-10-CM | POA: Diagnosis not present

## 2019-07-03 DIAGNOSIS — E119 Type 2 diabetes mellitus without complications: Secondary | ICD-10-CM | POA: Diagnosis not present

## 2019-07-13 DIAGNOSIS — I130 Hypertensive heart and chronic kidney disease with heart failure and stage 1 through stage 4 chronic kidney disease, or unspecified chronic kidney disease: Secondary | ICD-10-CM | POA: Diagnosis present

## 2019-07-13 DIAGNOSIS — R0602 Shortness of breath: Secondary | ICD-10-CM | POA: Diagnosis not present

## 2019-07-13 DIAGNOSIS — U071 COVID-19: Secondary | ICD-10-CM | POA: Diagnosis not present

## 2019-07-13 DIAGNOSIS — Z7984 Long term (current) use of oral hypoglycemic drugs: Secondary | ICD-10-CM | POA: Diagnosis not present

## 2019-07-13 DIAGNOSIS — Z91041 Radiographic dye allergy status: Secondary | ICD-10-CM | POA: Diagnosis not present

## 2019-07-13 DIAGNOSIS — J80 Acute respiratory distress syndrome: Secondary | ICD-10-CM | POA: Diagnosis not present

## 2019-07-13 DIAGNOSIS — J9601 Acute respiratory failure with hypoxia: Secondary | ICD-10-CM | POA: Diagnosis not present

## 2019-07-13 DIAGNOSIS — Z951 Presence of aortocoronary bypass graft: Secondary | ICD-10-CM | POA: Diagnosis not present

## 2019-07-13 DIAGNOSIS — E1122 Type 2 diabetes mellitus with diabetic chronic kidney disease: Secondary | ICD-10-CM | POA: Diagnosis present

## 2019-07-13 DIAGNOSIS — H9190 Unspecified hearing loss, unspecified ear: Secondary | ICD-10-CM | POA: Diagnosis present

## 2019-07-13 DIAGNOSIS — I4891 Unspecified atrial fibrillation: Secondary | ICD-10-CM | POA: Diagnosis not present

## 2019-07-13 DIAGNOSIS — J189 Pneumonia, unspecified organism: Secondary | ICD-10-CM | POA: Diagnosis not present

## 2019-07-13 DIAGNOSIS — I5033 Acute on chronic diastolic (congestive) heart failure: Secondary | ICD-10-CM | POA: Diagnosis not present

## 2019-07-13 DIAGNOSIS — R748 Abnormal levels of other serum enzymes: Secondary | ICD-10-CM | POA: Diagnosis not present

## 2019-07-13 DIAGNOSIS — I2510 Atherosclerotic heart disease of native coronary artery without angina pectoris: Secondary | ICD-10-CM | POA: Diagnosis not present

## 2019-07-13 DIAGNOSIS — Z87891 Personal history of nicotine dependence: Secondary | ICD-10-CM | POA: Diagnosis not present

## 2019-07-13 DIAGNOSIS — I509 Heart failure, unspecified: Secondary | ICD-10-CM | POA: Diagnosis not present

## 2019-07-13 DIAGNOSIS — J1282 Pneumonia due to coronavirus disease 2019: Secondary | ICD-10-CM | POA: Diagnosis present

## 2019-07-13 DIAGNOSIS — N179 Acute kidney failure, unspecified: Secondary | ICD-10-CM | POA: Diagnosis not present

## 2019-07-13 DIAGNOSIS — R69 Illness, unspecified: Secondary | ICD-10-CM | POA: Diagnosis not present

## 2019-07-13 DIAGNOSIS — N390 Urinary tract infection, site not specified: Secondary | ICD-10-CM | POA: Diagnosis not present

## 2019-07-13 DIAGNOSIS — R0902 Hypoxemia: Secondary | ICD-10-CM | POA: Diagnosis not present

## 2019-07-13 DIAGNOSIS — N184 Chronic kidney disease, stage 4 (severe): Secondary | ICD-10-CM | POA: Diagnosis present

## 2019-07-13 DIAGNOSIS — I499 Cardiac arrhythmia, unspecified: Secondary | ICD-10-CM | POA: Diagnosis not present

## 2019-07-13 DIAGNOSIS — B961 Klebsiella pneumoniae [K. pneumoniae] as the cause of diseases classified elsewhere: Secondary | ICD-10-CM | POA: Diagnosis present

## 2019-07-13 DIAGNOSIS — I252 Old myocardial infarction: Secondary | ICD-10-CM | POA: Diagnosis not present

## 2019-07-13 DIAGNOSIS — Z66 Do not resuscitate: Secondary | ICD-10-CM | POA: Diagnosis not present

## 2019-07-13 DIAGNOSIS — R7989 Other specified abnormal findings of blood chemistry: Secondary | ICD-10-CM | POA: Diagnosis not present

## 2019-07-13 DIAGNOSIS — R5381 Other malaise: Secondary | ICD-10-CM | POA: Diagnosis not present

## 2019-07-13 DIAGNOSIS — Z7982 Long term (current) use of aspirin: Secondary | ICD-10-CM | POA: Diagnosis not present

## 2019-07-13 DIAGNOSIS — R531 Weakness: Secondary | ICD-10-CM | POA: Diagnosis not present

## 2019-07-13 DIAGNOSIS — I10 Essential (primary) hypertension: Secondary | ICD-10-CM | POA: Diagnosis not present

## 2019-07-13 DIAGNOSIS — E785 Hyperlipidemia, unspecified: Secondary | ICD-10-CM | POA: Diagnosis present

## 2019-07-19 DIAGNOSIS — I509 Heart failure, unspecified: Secondary | ICD-10-CM | POA: Diagnosis not present

## 2019-07-23 DIAGNOSIS — J189 Pneumonia, unspecified organism: Secondary | ICD-10-CM | POA: Diagnosis not present

## 2019-07-23 DIAGNOSIS — U071 COVID-19: Secondary | ICD-10-CM | POA: Diagnosis not present

## 2019-07-23 DIAGNOSIS — I4891 Unspecified atrial fibrillation: Secondary | ICD-10-CM | POA: Diagnosis not present

## 2019-07-23 DIAGNOSIS — N390 Urinary tract infection, site not specified: Secondary | ICD-10-CM | POA: Diagnosis not present

## 2019-07-25 ENCOUNTER — Ambulatory Visit (INDEPENDENT_AMBULATORY_CARE_PROVIDER_SITE_OTHER): Payer: Medicare Other | Admitting: Nurse Practitioner

## 2019-07-25 ENCOUNTER — Ambulatory Visit (INDEPENDENT_AMBULATORY_CARE_PROVIDER_SITE_OTHER): Payer: Medicare Other | Admitting: Gastroenterology

## 2019-07-25 DIAGNOSIS — I4891 Unspecified atrial fibrillation: Secondary | ICD-10-CM | POA: Diagnosis not present

## 2019-07-25 DIAGNOSIS — U071 COVID-19: Secondary | ICD-10-CM | POA: Diagnosis not present

## 2019-07-25 DIAGNOSIS — I25119 Atherosclerotic heart disease of native coronary artery with unspecified angina pectoris: Secondary | ICD-10-CM | POA: Diagnosis not present

## 2019-07-25 DIAGNOSIS — I5032 Chronic diastolic (congestive) heart failure: Secondary | ICD-10-CM | POA: Diagnosis not present

## 2019-07-27 DIAGNOSIS — R918 Other nonspecific abnormal finding of lung field: Secondary | ICD-10-CM | POA: Diagnosis not present

## 2019-07-27 DIAGNOSIS — E860 Dehydration: Secondary | ICD-10-CM | POA: Diagnosis not present

## 2019-07-27 DIAGNOSIS — E875 Hyperkalemia: Secondary | ICD-10-CM | POA: Diagnosis not present

## 2019-07-27 DIAGNOSIS — U071 COVID-19: Secondary | ICD-10-CM | POA: Diagnosis not present

## 2019-07-27 DIAGNOSIS — J1282 Pneumonia due to coronavirus disease 2019: Secondary | ICD-10-CM | POA: Diagnosis not present

## 2019-07-27 DIAGNOSIS — R69 Illness, unspecified: Secondary | ICD-10-CM | POA: Diagnosis not present

## 2019-07-28 DIAGNOSIS — N179 Acute kidney failure, unspecified: Secondary | ICD-10-CM | POA: Diagnosis not present

## 2019-07-28 DIAGNOSIS — A4181 Sepsis due to Enterococcus: Secondary | ICD-10-CM | POA: Diagnosis not present

## 2019-07-28 DIAGNOSIS — J189 Pneumonia, unspecified organism: Secondary | ICD-10-CM | POA: Diagnosis not present

## 2019-07-28 DIAGNOSIS — U071 COVID-19: Secondary | ICD-10-CM | POA: Diagnosis not present

## 2019-07-29 DIAGNOSIS — N179 Acute kidney failure, unspecified: Secondary | ICD-10-CM | POA: Diagnosis not present

## 2019-07-29 DIAGNOSIS — A4181 Sepsis due to Enterococcus: Secondary | ICD-10-CM | POA: Diagnosis not present

## 2019-07-29 DIAGNOSIS — U071 COVID-19: Secondary | ICD-10-CM | POA: Diagnosis not present

## 2019-07-29 DIAGNOSIS — J189 Pneumonia, unspecified organism: Secondary | ICD-10-CM | POA: Diagnosis not present

## 2019-07-30 DIAGNOSIS — I4891 Unspecified atrial fibrillation: Secondary | ICD-10-CM | POA: Diagnosis not present

## 2019-07-30 DIAGNOSIS — U071 COVID-19: Secondary | ICD-10-CM | POA: Diagnosis not present

## 2019-07-30 DIAGNOSIS — J189 Pneumonia, unspecified organism: Secondary | ICD-10-CM | POA: Diagnosis not present

## 2019-07-30 DIAGNOSIS — A4181 Sepsis due to Enterococcus: Secondary | ICD-10-CM | POA: Diagnosis not present

## 2019-07-30 DIAGNOSIS — N390 Urinary tract infection, site not specified: Secondary | ICD-10-CM | POA: Diagnosis not present

## 2019-07-30 DIAGNOSIS — E1169 Type 2 diabetes mellitus with other specified complication: Secondary | ICD-10-CM | POA: Diagnosis not present

## 2019-07-30 DIAGNOSIS — N179 Acute kidney failure, unspecified: Secondary | ICD-10-CM | POA: Diagnosis not present

## 2019-07-31 DIAGNOSIS — U071 COVID-19: Secondary | ICD-10-CM | POA: Diagnosis not present

## 2019-07-31 DIAGNOSIS — J189 Pneumonia, unspecified organism: Secondary | ICD-10-CM | POA: Diagnosis not present

## 2019-07-31 DIAGNOSIS — N179 Acute kidney failure, unspecified: Secondary | ICD-10-CM | POA: Diagnosis not present

## 2019-07-31 DIAGNOSIS — A4181 Sepsis due to Enterococcus: Secondary | ICD-10-CM | POA: Diagnosis not present

## 2019-08-01 DIAGNOSIS — A4181 Sepsis due to Enterococcus: Secondary | ICD-10-CM | POA: Diagnosis not present

## 2019-08-01 DIAGNOSIS — J189 Pneumonia, unspecified organism: Secondary | ICD-10-CM | POA: Diagnosis not present

## 2019-08-01 DIAGNOSIS — N179 Acute kidney failure, unspecified: Secondary | ICD-10-CM | POA: Diagnosis not present

## 2019-08-01 DIAGNOSIS — R7881 Bacteremia: Secondary | ICD-10-CM | POA: Diagnosis not present

## 2019-08-01 DIAGNOSIS — U071 COVID-19: Secondary | ICD-10-CM | POA: Diagnosis not present

## 2019-08-02 DIAGNOSIS — J189 Pneumonia, unspecified organism: Secondary | ICD-10-CM | POA: Diagnosis not present

## 2019-08-02 DIAGNOSIS — U071 COVID-19: Secondary | ICD-10-CM | POA: Diagnosis not present

## 2019-08-02 DIAGNOSIS — A4181 Sepsis due to Enterococcus: Secondary | ICD-10-CM | POA: Diagnosis not present

## 2019-08-02 DIAGNOSIS — N179 Acute kidney failure, unspecified: Secondary | ICD-10-CM | POA: Diagnosis not present

## 2019-08-03 ENCOUNTER — Other Ambulatory Visit: Payer: Self-pay | Admitting: *Deleted

## 2019-08-03 DIAGNOSIS — J189 Pneumonia, unspecified organism: Secondary | ICD-10-CM | POA: Diagnosis not present

## 2019-08-03 DIAGNOSIS — N179 Acute kidney failure, unspecified: Secondary | ICD-10-CM | POA: Diagnosis not present

## 2019-08-03 DIAGNOSIS — A4181 Sepsis due to Enterococcus: Secondary | ICD-10-CM | POA: Diagnosis not present

## 2019-08-03 DIAGNOSIS — U071 COVID-19: Secondary | ICD-10-CM | POA: Diagnosis not present

## 2019-08-03 NOTE — Patient Outreach (Signed)
Member screened for potential [facility] Care Management needs as a benefit of [place] [employer].  Telephone call made to [facility] daughter Corliss Skains [phone]. Patient identifiers confirmed.  Jenny Reichmann report member is currently in ICU in [place] reports member recently transferred to hospital from [facility].   Discussed that writer will follow up at later time if member returns to [facility].    Marthenia Rolling, MSN-Ed, RN,BSN [place] Acute Care Coordinator [phone] [facility]) [phone]  (Toll free office)

## 2019-08-04 DIAGNOSIS — J189 Pneumonia, unspecified organism: Secondary | ICD-10-CM | POA: Diagnosis not present

## 2019-08-04 DIAGNOSIS — U071 COVID-19: Secondary | ICD-10-CM | POA: Diagnosis not present

## 2019-08-04 DIAGNOSIS — N179 Acute kidney failure, unspecified: Secondary | ICD-10-CM | POA: Diagnosis not present

## 2019-08-04 DIAGNOSIS — A4181 Sepsis due to Enterococcus: Secondary | ICD-10-CM | POA: Diagnosis not present

## 2019-08-05 DIAGNOSIS — U071 COVID-19: Secondary | ICD-10-CM | POA: Diagnosis not present

## 2019-08-05 DIAGNOSIS — A4181 Sepsis due to Enterococcus: Secondary | ICD-10-CM | POA: Diagnosis not present

## 2019-08-05 DIAGNOSIS — N179 Acute kidney failure, unspecified: Secondary | ICD-10-CM | POA: Diagnosis not present

## 2019-08-05 DIAGNOSIS — J189 Pneumonia, unspecified organism: Secondary | ICD-10-CM | POA: Diagnosis not present

## 2019-08-06 DIAGNOSIS — A4181 Sepsis due to Enterococcus: Secondary | ICD-10-CM | POA: Diagnosis not present

## 2019-08-06 DIAGNOSIS — N179 Acute kidney failure, unspecified: Secondary | ICD-10-CM | POA: Diagnosis not present

## 2019-08-06 DIAGNOSIS — J189 Pneumonia, unspecified organism: Secondary | ICD-10-CM | POA: Diagnosis not present

## 2019-08-06 DIAGNOSIS — U071 COVID-19: Secondary | ICD-10-CM | POA: Diagnosis not present

## 2019-08-07 DIAGNOSIS — N179 Acute kidney failure, unspecified: Secondary | ICD-10-CM | POA: Diagnosis not present

## 2019-08-07 DIAGNOSIS — J189 Pneumonia, unspecified organism: Secondary | ICD-10-CM | POA: Diagnosis not present

## 2019-08-07 DIAGNOSIS — I25119 Atherosclerotic heart disease of native coronary artery with unspecified angina pectoris: Secondary | ICD-10-CM | POA: Diagnosis not present

## 2019-08-07 DIAGNOSIS — K219 Gastro-esophageal reflux disease without esophagitis: Secondary | ICD-10-CM | POA: Diagnosis not present

## 2019-08-07 DIAGNOSIS — R278 Other lack of coordination: Secondary | ICD-10-CM | POA: Diagnosis not present

## 2019-08-07 DIAGNOSIS — R2689 Other abnormalities of gait and mobility: Secondary | ICD-10-CM | POA: Diagnosis not present

## 2019-08-07 DIAGNOSIS — U071 COVID-19: Secondary | ICD-10-CM | POA: Diagnosis not present

## 2019-08-07 DIAGNOSIS — E440 Moderate protein-calorie malnutrition: Secondary | ICD-10-CM | POA: Diagnosis not present

## 2019-08-07 DIAGNOSIS — R41841 Cognitive communication deficit: Secondary | ICD-10-CM | POA: Diagnosis not present

## 2019-08-07 DIAGNOSIS — R1310 Dysphagia, unspecified: Secondary | ICD-10-CM | POA: Diagnosis not present

## 2019-08-07 DIAGNOSIS — I10 Essential (primary) hypertension: Secondary | ICD-10-CM | POA: Diagnosis not present

## 2019-08-07 DIAGNOSIS — Z23 Encounter for immunization: Secondary | ICD-10-CM | POA: Diagnosis not present

## 2019-08-07 DIAGNOSIS — I5023 Acute on chronic systolic (congestive) heart failure: Secondary | ICD-10-CM | POA: Diagnosis not present

## 2019-08-07 DIAGNOSIS — I5033 Acute on chronic diastolic (congestive) heart failure: Secondary | ICD-10-CM | POA: Diagnosis not present

## 2019-08-07 DIAGNOSIS — E1169 Type 2 diabetes mellitus with other specified complication: Secondary | ICD-10-CM | POA: Diagnosis not present

## 2019-08-07 DIAGNOSIS — A4181 Sepsis due to Enterococcus: Secondary | ICD-10-CM | POA: Diagnosis not present

## 2019-08-07 DIAGNOSIS — N390 Urinary tract infection, site not specified: Secondary | ICD-10-CM | POA: Diagnosis not present

## 2019-08-07 DIAGNOSIS — J1282 Pneumonia due to coronavirus disease 2019: Secondary | ICD-10-CM | POA: Diagnosis not present

## 2019-08-07 DIAGNOSIS — N184 Chronic kidney disease, stage 4 (severe): Secondary | ICD-10-CM | POA: Diagnosis not present

## 2019-08-07 DIAGNOSIS — M6281 Muscle weakness (generalized): Secondary | ICD-10-CM | POA: Diagnosis not present

## 2019-08-07 DIAGNOSIS — R739 Hyperglycemia, unspecified: Secondary | ICD-10-CM | POA: Diagnosis not present

## 2019-08-07 DIAGNOSIS — R1312 Dysphagia, oropharyngeal phase: Secondary | ICD-10-CM | POA: Diagnosis not present

## 2019-08-07 DIAGNOSIS — I4891 Unspecified atrial fibrillation: Secondary | ICD-10-CM | POA: Diagnosis not present

## 2019-08-08 DIAGNOSIS — E1169 Type 2 diabetes mellitus with other specified complication: Secondary | ICD-10-CM | POA: Diagnosis not present

## 2019-08-08 DIAGNOSIS — I4891 Unspecified atrial fibrillation: Secondary | ICD-10-CM | POA: Diagnosis not present

## 2019-08-08 DIAGNOSIS — I25119 Atherosclerotic heart disease of native coronary artery with unspecified angina pectoris: Secondary | ICD-10-CM | POA: Diagnosis not present

## 2019-08-08 DIAGNOSIS — U071 COVID-19: Secondary | ICD-10-CM | POA: Diagnosis not present

## 2019-08-09 ENCOUNTER — Other Ambulatory Visit: Payer: Self-pay | Admitting: *Deleted

## 2019-08-09 NOTE — Patient Outreach (Signed)
Screened for potential [facility] Care Management needs as a benefit of  [employer].  John Moore is currently receiving skilled therapy at [facility].  Writer attended telephonic interdisciplinary team meeting to assess for disposition needs and transition plan for resident.   Will continue to follow while member resides in SNF.    Marthenia Rolling, MSN-Ed, RN,BSN [place] Acute Care Coordinator [phone] [facility]) [phone]  (Toll free office)

## 2019-08-13 DIAGNOSIS — U071 COVID-19: Secondary | ICD-10-CM | POA: Diagnosis not present

## 2019-08-13 DIAGNOSIS — I5023 Acute on chronic systolic (congestive) heart failure: Secondary | ICD-10-CM | POA: Diagnosis not present

## 2019-08-13 DIAGNOSIS — N184 Chronic kidney disease, stage 4 (severe): Secondary | ICD-10-CM | POA: Diagnosis not present

## 2019-08-13 DIAGNOSIS — E1169 Type 2 diabetes mellitus with other specified complication: Secondary | ICD-10-CM | POA: Diagnosis not present

## 2019-08-13 DIAGNOSIS — J1282 Pneumonia due to coronavirus disease 2019: Secondary | ICD-10-CM | POA: Diagnosis not present

## 2019-08-14 DIAGNOSIS — R1310 Dysphagia, unspecified: Secondary | ICD-10-CM | POA: Diagnosis not present

## 2019-08-14 DIAGNOSIS — U071 COVID-19: Secondary | ICD-10-CM | POA: Diagnosis not present

## 2019-08-14 DIAGNOSIS — I4891 Unspecified atrial fibrillation: Secondary | ICD-10-CM | POA: Diagnosis not present

## 2019-09-06 DEATH — deceased
